# Patient Record
Sex: Female | Born: 1980
Health system: Southern US, Community
[De-identification: ages and names within clinical notes are randomized; demographics above are authoritative.]

## PROBLEM LIST (undated history)

## (undated) DIAGNOSIS — E349 Endocrine disorder, unspecified: Secondary | ICD-10-CM

## (undated) DIAGNOSIS — R748 Abnormal levels of other serum enzymes: Secondary | ICD-10-CM

## (undated) DIAGNOSIS — E282 Polycystic ovarian syndrome: Secondary | ICD-10-CM

## (undated) DIAGNOSIS — F32A Depression, unspecified: Secondary | ICD-10-CM

## (undated) DIAGNOSIS — L409 Psoriasis, unspecified: Secondary | ICD-10-CM

## (undated) DIAGNOSIS — F329 Major depressive disorder, single episode, unspecified: Secondary | ICD-10-CM

## (undated) DIAGNOSIS — N979 Female infertility, unspecified: Secondary | ICD-10-CM

## (undated) DIAGNOSIS — N912 Amenorrhea, unspecified: Secondary | ICD-10-CM

## (undated) HISTORY — DX: Psoriasis, unspecified: L40.9

## (undated) HISTORY — DX: Amenorrhea, unspecified: N91.2

## (undated) HISTORY — DX: Abnormal levels of other serum enzymes: R74.8

## (undated) HISTORY — PX: CHOLECYSTECTOMY: SHX55

## (undated) HISTORY — DX: Endocrine disorder, unspecified: E34.9

## (undated) HISTORY — PX: TONSILLECTOMY: SUR1361

## (undated) HISTORY — PX: COSMETIC SURGERY: SHX468

## (undated) HISTORY — PX: LAPAROSCOPIC GASTRIC SLEEVE RESECTION: SHX5895

## (undated) HISTORY — DX: Depression, unspecified: F32.A

## (undated) HISTORY — DX: Polycystic ovarian syndrome: E28.2

## (undated) HISTORY — DX: Major depressive disorder, single episode, unspecified: F32.9

## (undated) HISTORY — DX: Female infertility, unspecified: N97.9

---
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
## 2011-07-27 HISTORY — PX: BACK SURGERY: SHX140

## 2016-04-22 ENCOUNTER — Emergency Department (HOSPITAL_BASED_OUTPATIENT_CLINIC_OR_DEPARTMENT_OTHER)
Admission: EM | Admit: 2016-04-22 | Discharge: 2016-04-22 | Disposition: A | Discharge status: Home / Self Care | Payer: BLUE CROSS/BLUE SHIELD | Attending: Emergency Medicine | Admitting: Emergency Medicine

## 2016-04-22 ENCOUNTER — Encounter (HOSPITAL_BASED_OUTPATIENT_CLINIC_OR_DEPARTMENT_OTHER): Payer: Self-pay | Admitting: Emergency Medicine

## 2016-04-22 ENCOUNTER — Emergency Department (HOSPITAL_BASED_OUTPATIENT_CLINIC_OR_DEPARTMENT_OTHER): Payer: BLUE CROSS/BLUE SHIELD

## 2016-04-22 DIAGNOSIS — R1084 Generalized abdominal pain: Secondary | ICD-10-CM | POA: Diagnosis not present

## 2016-04-22 DIAGNOSIS — J988 Other specified respiratory disorders: Secondary | ICD-10-CM | POA: Diagnosis not present

## 2016-04-22 DIAGNOSIS — B9789 Other viral agents as the cause of diseases classified elsewhere: Secondary | ICD-10-CM

## 2016-04-22 DIAGNOSIS — Z79899 Other long term (current) drug therapy: Secondary | ICD-10-CM | POA: Diagnosis not present

## 2016-04-22 DIAGNOSIS — R000 Tachycardia, unspecified: Secondary | ICD-10-CM | POA: Insufficient documentation

## 2016-04-22 DIAGNOSIS — R509 Fever, unspecified: Secondary | ICD-10-CM | POA: Diagnosis present

## 2016-04-22 LAB — URINALYSIS, ROUTINE W REFLEX MICROSCOPIC
GLUCOSE, UA: NEGATIVE mg/dL
HGB URINE DIPSTICK: NEGATIVE
KETONES UR: NEGATIVE mg/dL
NITRITE: NEGATIVE
PH: 5.5 (ref 5.0–8.0)
Protein, ur: NEGATIVE mg/dL
SPECIFIC GRAVITY, URINE: 1.02 (ref 1.005–1.030)

## 2016-04-22 LAB — CBC WITH DIFFERENTIAL/PLATELET
Basophils Absolute: 0 10*3/uL (ref 0.0–0.1)
Basophils Relative: 0 %
EOS ABS: 0.1 10*3/uL (ref 0.0–0.7)
EOS PCT: 1 %
HCT: 41 % (ref 36.0–46.0)
Hemoglobin: 13.5 g/dL (ref 12.0–15.0)
LYMPHS ABS: 2.4 10*3/uL (ref 0.7–4.0)
LYMPHS PCT: 18 %
MCH: 31.5 pg (ref 26.0–34.0)
MCHC: 32.9 g/dL (ref 30.0–36.0)
MCV: 95.6 fL (ref 78.0–100.0)
MONO ABS: 0.8 10*3/uL (ref 0.1–1.0)
MONOS PCT: 6 %
Neutro Abs: 10 10*3/uL — ABNORMAL HIGH (ref 1.7–7.7)
Neutrophils Relative %: 75 %
PLATELETS: 231 10*3/uL (ref 150–400)
RBC: 4.29 MIL/uL (ref 3.87–5.11)
RDW: 13.5 % (ref 11.5–15.5)
WBC: 13.3 10*3/uL — ABNORMAL HIGH (ref 4.0–10.5)

## 2016-04-22 LAB — URINE MICROSCOPIC-ADD ON: RBC / HPF: NONE SEEN RBC/hpf (ref 0–5)

## 2016-04-22 LAB — COMPREHENSIVE METABOLIC PANEL
ALK PHOS: 77 U/L (ref 38–126)
ALT: 32 U/L (ref 14–54)
AST: 21 U/L (ref 15–41)
Albumin: 3.7 g/dL (ref 3.5–5.0)
Anion gap: 8 (ref 5–15)
BUN: 9 mg/dL (ref 6–20)
CALCIUM: 9.1 mg/dL (ref 8.9–10.3)
CHLORIDE: 102 mmol/L (ref 101–111)
CO2: 27 mmol/L (ref 22–32)
CREATININE: 0.62 mg/dL (ref 0.44–1.00)
GFR calc non Af Amer: >60 mL/min (ref >60)
Glucose, Bld: 106 mg/dL — ABNORMAL HIGH (ref 65–99)
Potassium: 3.2 mmol/L — ABNORMAL LOW (ref 3.5–5.1)
SODIUM: 137 mmol/L (ref 135–145)
Total Bilirubin: 0.7 mg/dL (ref 0.3–1.2)
Total Protein: 8 g/dL (ref 6.5–8.1)

## 2016-04-22 LAB — I-STAT CG4 LACTIC ACID, ED: Lactic Acid, Venous: 1.13 mmol/L (ref 0.5–1.9)

## 2016-04-22 LAB — PREGNANCY, URINE: Preg Test, Ur: NEGATIVE

## 2016-04-22 MED ORDER — IOPAMIDOL (ISOVUE-300) INJECTION 61%
100.0000 mL | Freq: Once | INTRAVENOUS | Status: AC | PRN
  Administered 2016-04-22: 100 mL via INTRAVENOUS

## 2016-04-22 NOTE — ED Triage Notes (Addendum)
Patient reports that she went to the urgent care about 2-3 days ago. The patient reports that she was there because of fever, Sore throat and tight distended abdominal pain. Reports that she is also having a head ache

## 2016-04-22 NOTE — ED Provider Notes (Signed)
[facility] Provider Note   CSN: [id number] Arrival date & time: 04/22/16  1650   By signing my name below, I, Avnee Patel, attest that this documentation has been prepared under the direction and in the presence of Melene Plan, DO  Electronically Signed: Clovis Pu, ED Scribe. 04/22/16. 7:48 PM.   History   Chief Complaint Chief Complaint  Patient presents with  . Fever   .  The history is provided by the patient. No language interpreter was used.   HPI Comments:  Rebecca Snyder is a 35 y.o. female, with a PSHx of a tummy tuck and back lift, who presents to the Emergency Department complaining of intermittent fevers onset 3 days ago. She notes associated sore throat, headaches, abdominal pain and abdominal swelling.  Pt notes her abdominal pain is exacerbated to the touch. She states she visited Urgent Care 3 days ago and was told she might have a bacterial infection. She was given Zofran and motrin with no relief. Pt denies associated cough.   History reviewed. No pertinent past medical history.  There are no active problems to display for this patient.   Past Surgical History:  Procedure Laterality Date  . BACK SURGERY    . CESAREAN SECTION    . CHOLECYSTECTOMY    . LAPAROSCOPIC GASTRIC SLEEVE RESECTION    . TONSILLECTOMY    . tummy tuck       OB History    No data available       Home Medications    Prior to Admission medications   Medication Sig Start Date End Date Taking? Authorizing Provider  Melatonin 10 MG CAPS Take by mouth.   Yes Historical Provider, MD  venlafaxine (EFFEXOR) 75 MG tablet Take 75 mg by mouth 2 (two) times daily.   Yes Historical Provider, MD  zolpidem (AMBIEN) 10 MG tablet Take 10 mg by mouth at bedtime as needed for sleep.   Yes Historical Provider, MD    Family History History reviewed. No pertinent family history.  Social History Social History  Substance Use Topics  . Smoking status: Never Smoker  . Smokeless  tobacco: Never Used  . Alcohol use No     Allergies   Review of patient's allergies indicates no known allergies.   Review of Systems Review of Systems  Constitutional: Positive for fever. Negative for chills.  HENT: Positive for sore throat. Negative for congestion and rhinorrhea.   Eyes: Negative for redness and visual disturbance.  Respiratory: Negative for cough, shortness of breath and wheezing.   Cardiovascular: Negative for chest pain and palpitations.  Gastrointestinal: Positive for abdominal pain. Negative for nausea and vomiting.  Genitourinary: Negative for dysuria and urgency.  Musculoskeletal: Negative for arthralgias and myalgias.  Skin: Negative for pallor and wound.  Neurological: Positive for headaches. Negative for dizziness.     Physical Exam Updated Vital Signs BP 118/72   Pulse 92   Temp 98.9 F (37.2 C) (Oral)   Resp 18   Ht 5\' 10"  (1.778 m)   Wt 230 lb (104.3 kg)   LMP 04/06/2016   SpO2 98%   BMI 33.00 kg/m   Physical Exam  Constitutional: She is oriented to person, place, and time. She appears well-developed and well-nourished. No distress.  HENT:  Head: Normocephalic and atraumatic.  Right Ear: Tympanic membrane normal.  Left Ear: Tympanic membrane normal.  Mouth/Throat: Posterior oropharyngeal erythema present.  No sinus tenderness. TM's normal bilaterally. Very mild posterior oropharynx erythema. Swollen turbinate and posterior  nasal drip   Eyes: EOM are normal. Pupils are equal, round, and reactive to light.  Neck: Normal range of motion. Neck supple.  Cardiovascular: Regular rhythm.  Exam reveals no gallop and no friction rub.   No murmur heard. Tachycardic  Pulmonary/Chest: Effort normal. She has no wheezes. She has no rales.  Abdominal: Soft. She exhibits no distension. There is tenderness.  Diffuse tenderness to abdomen on palpation    Musculoskeletal: She exhibits no edema or tenderness.  Neurological: She is alert and oriented  to person, place, and time.  Skin: Skin is warm and dry. She is not diaphoretic.  Psychiatric: She has a normal mood and affect. Her behavior is normal.  Nursing note and vitals reviewed.    ED Treatments / Results  DIAGNOSTIC STUDIES:  Oxygen Saturation is 99% on RA, normal by my interpretation.    COORDINATION OF CARE:  7:23 PM Discussed treatment plan with pt at bedside and pt agreed to plan.  Labs (all labs ordered are listed, but only abnormal results are displayed) Labs Reviewed  COMPREHENSIVE METABOLIC PANEL - Abnormal; Notable for the following:       Result Value   Potassium 3.2 (*)    Glucose, Bld 106 (*)    All other components within normal limits  URINALYSIS, ROUTINE W REFLEX MICROSCOPIC (NOT AT [facility]) - Abnormal; Notable for the following:    Color, Urine AMBER (*)    APPearance CLOUDY (*)    Bilirubin Urine SMALL (*)    Leukocytes, UA SMALL (*)    All other components within normal limits  CBC WITH DIFFERENTIAL/PLATELET - Abnormal; Notable for the following:    WBC 13.3 (*)    Neutro Abs 10.0 (*)    All other components within normal limits  URINE MICROSCOPIC-ADD ON - Abnormal; Notable for the following:    Squamous Epithelial / LPF 0-5 (*)    Bacteria, UA RARE (*)    All other components within normal limits  PREGNANCY, URINE  I-STAT CG4 LACTIC ACID, ED    EKG  EKG Interpretation None       Radiology Ct Abdomen Pelvis W Contrast  Result Date: 04/22/2016 CLINICAL DATA:  Flu like symptoms.  Lower abdominal pain. EXAM: CT ABDOMEN AND PELVIS WITH CONTRAST TECHNIQUE: Multidetector CT imaging of the abdomen and pelvis was performed using the standard protocol following bolus administration of intravenous contrast. CONTRAST:  100mL ISOVUE-300 IOPAMIDOL (ISOVUE-300) INJECTION 61% COMPARISON:  None. FINDINGS: Lower chest: No acute abnormality. Hepatobiliary: Mild hepatic steatosis. Previous coli cystectomy. No biliary dilatation. Pancreas: Unremarkable. No  pancreatic ductal dilatation or surrounding inflammatory changes. Spleen: Normal in size without focal abnormality. Adrenals/Urinary Tract: Normal adrenal glands. There is a small low attenuation structure in the posterior right kidney measuring 5 mm. Too small to reliably characterize. The urinary bladder appears normal. Stomach/Bowel: Postoperative changes involving the stomach compatible with sleeve gastrectomy. The small bowel loops have a normal caliber. There is no pathologic dilatation of the colon. Vascular/Lymphatic: No significant vascular findings are present. No enlarged abdominal or pelvic lymph nodes. Reproductive: Several cysts are identified involving the left ovary. The largest measures 3.7 cm. The uterus is unremarkable. Other: There is no ascites or focal fluid collections within the abdomen or pelvis. There is skin thickening and mild subcutaneous fat stranding involving the lower ventral abdominal wall. No fluid collections identified to suggest abscess. Musculoskeletal: The stomach is within normal limits. The small bowel loops have a normal course and caliber. No obstruction. Normal appearance  of the colon. IMPRESSION: 1. No acute findings identified within the abdomen or pelvis. 2. There is mild skin thickening and subcutaneous fat stranding involving the ventral abdominal wall without focal fluid collections. Findings are nonspecific and may reflect post surgical changes. Correlate for any clinical signs or symptoms of cellulitis. 3. Left ovary cyst. This is almost certainly benign, and no specific imaging follow up is recommended according to the Society of Radiologists in Ultrasound2010 Consensus Conference Statement (D Lenis Noon et al. Management of Asymptomatic Ovarian and Other Adnexal Cysts Imaged at [place]: Society of Radiologists in Ultrasound Consensus Conference Statement 2010. Radiology 256 (Sept 2010): 943-954.). Electronically Signed   By: Signa Kell M.D.   On: 04/22/2016 21:26     Procedures Procedures (including critical care time)  Medications Ordered in ED Medications  iopamidol (ISOVUE-300) 61 % injection 100 mL (100 mLs Intravenous Contrast Given 04/22/16 2026)     Initial Impression / Assessment and Plan / ED Course  I have reviewed the triage vital signs and the nursing notes.  Pertinent labs & imaging results that were available during my care of the patient were reviewed by me and considered in my medical decision making (see chart for details).  Clinical Course    35 yo F With a chief complaint of cough congestion fevers and chills. She had been seen recently in urgent care and called back because she had leukocytosis. Patient also recently had a tummy tuck and had some swelling of her abdomen. CT scan of the abdomen and pelvis is negative for acute process. On the CT read there is some concern for possible cellulitis though there is none clinically. I discussed the results with the patient she'll treat this as a likely viral illness. PCP follow-up.  11:44 PM:  I have discussed the diagnosis/risks/treatment options with the patient and believe the pt to be eligible for discharge home to follow-up with PCP. We also discussed returning to the ED immediately if new or worsening sx occur. We discussed the sx which are most concerning (e.g., sudden worsening pain, fever, inability to tolerate by mouth) that necessitate immediate return. Medications administered to the patient during their visit and any new prescriptions provided to the patient are listed below.  Medications given during this visit Medications  iopamidol (ISOVUE-300) 61 % injection 100 mL (100 mLs Intravenous Contrast Given 04/22/16 2026)     The patient appears reasonably screen and/or stabilized for discharge and I doubt any other medical condition or other [facility] requiring further screening, evaluation, or treatment in the ED at this time prior to discharge.    Final Clinical  Impressions(s) / ED Diagnoses   Final diagnoses:  Viral respiratory illness    New Prescriptions Discharge Medication List as of 04/22/2016 10:04 PM     I personally performed the services described in this documentation, which was scribed in my presence. The recorded information has been reviewed and is accurate.     Melene Plan, DO 04/22/16 2344

## 2016-04-22 NOTE — Discharge Instructions (Signed)
Take tylenol 2 pills 4 times a day and motrin 4 pills 3 times a day.  Drink plenty of fluids.  Return for worsening shortness of breath, headache, confusion. Follow up with your family doctor.   

## 2016-07-23 ENCOUNTER — Ambulatory Visit (INDEPENDENT_AMBULATORY_CARE_PROVIDER_SITE_OTHER): Payer: BLUE CROSS/BLUE SHIELD | Admitting: Internal Medicine

## 2016-07-23 ENCOUNTER — Encounter: Payer: Self-pay | Admitting: Internal Medicine

## 2016-07-23 ENCOUNTER — Other Ambulatory Visit: Payer: Self-pay | Admitting: Internal Medicine

## 2016-07-23 VITALS — BP 128/90 | HR 94 | Temp 98.3°F | Ht 70.0 in | Wt 249.0 lb

## 2016-07-23 DIAGNOSIS — Z872 Personal history of diseases of the skin and subcutaneous tissue: Secondary | ICD-10-CM | POA: Diagnosis not present

## 2016-07-23 DIAGNOSIS — M25561 Pain in right knee: Secondary | ICD-10-CM | POA: Diagnosis not present

## 2016-07-23 DIAGNOSIS — R609 Edema, unspecified: Secondary | ICD-10-CM | POA: Insufficient documentation

## 2016-07-23 DIAGNOSIS — Z9884 Bariatric surgery status: Secondary | ICD-10-CM | POA: Diagnosis not present

## 2016-07-23 DIAGNOSIS — G4709 Other insomnia: Secondary | ICD-10-CM

## 2016-07-23 DIAGNOSIS — M79641 Pain in right hand: Secondary | ICD-10-CM

## 2016-07-23 DIAGNOSIS — M2540 Effusion, unspecified joint: Secondary | ICD-10-CM

## 2016-07-23 DIAGNOSIS — M79642 Pain in left hand: Secondary | ICD-10-CM | POA: Diagnosis not present

## 2016-07-23 DIAGNOSIS — G4700 Insomnia, unspecified: Secondary | ICD-10-CM | POA: Insufficient documentation

## 2016-07-23 DIAGNOSIS — Z8659 Personal history of other mental and behavioral disorders: Secondary | ICD-10-CM | POA: Insufficient documentation

## 2016-07-23 MED ORDER — TRIAMTERENE-HCTZ 75-50 MG PO TABS: 1.0000 | ORAL_TABLET | Freq: Every day | ORAL | 0 refills | Status: DC

## 2016-07-23 NOTE — Patient Instructions (Signed)
Rheumatology and orthopedic consults pending. Lab work pending. For dependent edema, try Maxide 75 1 by mouth daily. Please give us a call in 2-4 weeks and let us know how you're doing.

## 2016-07-23 NOTE — Progress Notes (Signed)
Subjective:    Patient ID: Rebecca Snyder, female    DOB: 03/21/1981, 35 y.o.   MRN: [id number]  HPI  First visit for this 35 year old White Female who recently moved here Fall 2017 from [place] with her husband and daughter to accept a job which [employer] . 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  Patient was referred by Clarnce FlockHeather Headley who is also a patient here.  In December 2017 she had a lower extremity Doppler right leg because of pain and edema for 4 weeks. Result was negative.  Patient has a history of psoriasis affecting her face and ears and elbow. She uses clobetasol for that.  Patient has had considerable issues with right knee pain and swelling. She used to play soccer and was quite physically active when she was younger. Doesn't recall any specific knee injury.  She's been having some issues with dependent edema.  However, she says she has tried Lasix in the past and it made her sick.  She tried some anti-inflammatory medication over-the-counter to help with the pain. It did help the swelling in her knee but did not help pain.  She said in some ways it actually felt worse than when the knee was swollen.  She's had some joint pain in her hands particularly PIP joints. No redness or increased warmth that she has noticed.  No pain in her elbows or shoulders. No significant ankle pain.  Patient has a history of depression and insomnia. This started around the time her father passed away a few years ago. She was very close to him. She takes Effexor XR 75 mg daily, Ambien 10 mg daily at bedtime, melatonin 10 mg daily at bedtime.  She had a disectomy and laminectomy 2013 for lumbar disc disease. She doesn't recall what level.  She had abdominoplasty and back lift surgery in 2017.  Tonsillectomy 2005, cholecystectomy 2003, C-section 2003, in vitro fertilization 2015. Gastric sleeve surgery 2012. She lost about 80 pounds with a gastric sleeve  surgery but gained some 50 pounds back around the time of her father's illness and subsequent death  Her primary care doctor in [place] was Dr. Dennison Nancyarmelo Mendiola.  Social history: She is married. Has a college education. One daughter age 34. Husband is 948 years old with hypertension otherwise in good health.  Family history: Father died at age 34 with complications of alpha-1 antitrypsin pulmonary disease and nonalcoholic hepatic cirrhosis. Mother age 34 in good health. One brother age 34 who is a alpha-1 antitrypsin carrier.  Patient has been tested and does not have this.    Review of Systems Last menstrual period December 25. Patient complains of back pain both high and low. Dependent edema.  Joint pain is described above    Objective:   Physical Exam  Constitutional: She is oriented to person, place, and time. She appears well-developed and well-nourished. No distress.  Eyes: Conjunctivae and EOM are normal. Pupils are equal, round, and reactive to light. Right eye exhibits no discharge. Left eye exhibits no discharge. No scleral icterus.  Neck: No thyromegaly present.  Cardiovascular: Normal rate, regular rhythm and normal heart sounds.   No murmur heard. Pulmonary/Chest: Effort normal. No respiratory distress. She has no rales.  Musculoskeletal: She exhibits no edema.  No lower extremity pitting edema. Prominent Heberden's and Bouchard's nodes in several joints in both hands.  moderate effusion right knee.  Neurological: She is alert and oriented to person, place, and time. She has  normal reflexes. No cranial nerve deficit. Coordination normal.  Skin: Skin is warm and dry. She is not diaphoretic.  Horizontal scar and brought line from Back lift surgery  Psychiatric: She has a normal mood and affect. Her behavior is normal. Judgment and thought content normal.  Vitals reviewed.         Assessment & Plan:  I think she probably has osteoarthritis of her hands although it is  early for her to be developing that. Some concern for possible psoriatic arthritis. I do think she may have some internal derangement of her right knee.  Plan: Rheumatology labs are drawn today and pending.  Patient will be sent for rheumatology consultation and we will also make an appointment for to see orthopedist.  We will try Maxide 75 to see if this helps with dependent edema.

## 2016-07-24 LAB — SEDIMENTATION RATE: Sed Rate: 10 mm/hr (ref 0–20)

## 2016-07-27 ENCOUNTER — Telehealth: Payer: Self-pay | Admitting: Internal Medicine

## 2016-07-27 LAB — COMPREHENSIVE METABOLIC PANEL

## 2016-07-27 LAB — ANA: Anti Nuclear Antibody(ANA): NEGATIVE

## 2016-07-27 LAB — RHEUMATOID FACTOR: Rhuematoid fact SerPl-aCnc: <14 IU/mL (ref <14)

## 2016-07-27 LAB — TSH

## 2016-07-27 LAB — CYCLIC CITRUL PEPTIDE ANTIBODY, IGG

## 2016-07-27 NOTE — Telephone Encounter (Signed)
Call to [facility] to add CMET and TSH.  CBC was unable to be done due to time of original labs.

## 2016-07-28 ENCOUNTER — Telehealth: Payer: Self-pay | Admitting: Internal Medicine

## 2016-07-28 NOTE — Telephone Encounter (Signed)
Message sent to patient to come in for additional lab work.

## 2016-07-28 NOTE — Telephone Encounter (Signed)
[facility] called and they did not have enough blood to process TSH and CMP.

## 2016-07-28 NOTE — Telephone Encounter (Signed)
Patient should come back and have the studies done

## 2016-07-29 ENCOUNTER — Telehealth: Payer: Self-pay | Admitting: Internal Medicine

## 2016-07-29 ENCOUNTER — Encounter: Payer: Self-pay | Admitting: Internal Medicine

## 2016-07-29 MED ORDER — FLUCONAZOLE 150 MG PO TABS: 150.0000 mg | ORAL_TABLET | Freq: Once | ORAL | 1 refills | Status: AC

## 2016-07-29 NOTE — Telephone Encounter (Signed)
Just get a b-met for now.

## 2016-07-29 NOTE — Telephone Encounter (Signed)
Patient called to schedule f/u lab appointment, but wanted to know if you wanted her to have the traditional fasting labs drawn as well. Please advise.

## 2016-07-29 NOTE — Telephone Encounter (Signed)
Call in Diflucan as requested.

## 2016-07-30 ENCOUNTER — Other Ambulatory Visit: Payer: BLUE CROSS/BLUE SHIELD | Admitting: Internal Medicine

## 2016-08-11 ENCOUNTER — Ambulatory Visit (INDEPENDENT_AMBULATORY_CARE_PROVIDER_SITE_OTHER): Payer: BLUE CROSS/BLUE SHIELD | Admitting: Orthopaedic Surgery

## 2016-08-20 ENCOUNTER — Encounter: Payer: BLUE CROSS/BLUE SHIELD | Admitting: Obstetrics & Gynecology

## 2016-08-23 ENCOUNTER — Other Ambulatory Visit: Payer: Self-pay | Admitting: Internal Medicine

## 2016-08-23 DIAGNOSIS — M2550 Pain in unspecified joint: Secondary | ICD-10-CM

## 2016-08-23 LAB — COMPREHENSIVE METABOLIC PANEL
ALK PHOS: 76 U/L (ref 33–115)
ALT: 41 U/L — AB (ref 6–29)
AST: 37 U/L — AB (ref 10–30)
Albumin: 4 g/dL (ref 3.6–5.1)
BILIRUBIN TOTAL: 0.4 mg/dL (ref 0.2–1.2)
BUN: 7 mg/dL (ref 7–25)
CO2: 28 mmol/L (ref 20–31)
CREATININE: 0.71 mg/dL (ref 0.50–1.10)
Calcium: 9.3 mg/dL (ref 8.6–10.2)
Chloride: 102 mmol/L (ref 98–110)
Glucose, Bld: 127 mg/dL — ABNORMAL HIGH (ref 65–99)
Potassium: 4 mmol/L (ref 3.5–5.3)
SODIUM: 138 mmol/L (ref 135–146)
TOTAL PROTEIN: 7.7 g/dL (ref 6.1–8.1)

## 2016-08-23 LAB — CBC WITH DIFFERENTIAL/PLATELET
BASOS PCT: 0 %
Basophils Absolute: 0 cells/uL (ref 0–200)
EOS ABS: 98 {cells}/uL (ref 15–500)
EOS PCT: 2 %
HCT: 45.5 % — ABNORMAL HIGH (ref 35.0–45.0)
Hemoglobin: 15.4 g/dL (ref 11.7–15.5)
Lymphocytes Relative: 36 %
Lymphs Abs: 1764 cells/uL (ref 850–3900)
MCH: 31.8 pg (ref 27.0–33.0)
MCHC: 33.8 g/dL (ref 32.0–36.0)
MCV: 93.8 fL (ref 80.0–100.0)
MONOS PCT: 7 %
MPV: 8.5 fL (ref 7.5–12.5)
Monocytes Absolute: 343 cells/uL (ref 200–950)
NEUTROS ABS: 2695 {cells}/uL (ref 1500–7800)
Neutrophils Relative %: 55 %
PLATELETS: 219 10*3/uL (ref 140–400)
RBC: 4.85 MIL/uL (ref 3.80–5.10)
RDW: 13.4 % (ref 11.0–15.0)
WBC: 4.9 10*3/uL (ref 3.8–10.8)

## 2016-08-25 ENCOUNTER — Ambulatory Visit (INDEPENDENT_AMBULATORY_CARE_PROVIDER_SITE_OTHER): Payer: 59 | Admitting: Orthopaedic Surgery

## 2016-08-25 ENCOUNTER — Ambulatory Visit (INDEPENDENT_AMBULATORY_CARE_PROVIDER_SITE_OTHER): Payer: 59

## 2016-08-25 DIAGNOSIS — M25562 Pain in left knee: Secondary | ICD-10-CM

## 2016-08-25 DIAGNOSIS — G8929 Other chronic pain: Secondary | ICD-10-CM

## 2016-08-25 DIAGNOSIS — M25561 Pain in right knee: Secondary | ICD-10-CM

## 2016-08-25 NOTE — Progress Notes (Signed)
Office Visit Note   Patient: Rebecca Snyder           Date of Birth: 10/20/1980           MRN: [id number] Visit Date: 08/25/2016              Requested by: Margaree MackintoshMary J Baxley, MD [id number] [place], [place] [id number] PCP: Margaree MackintoshBAXLEY,MARY J, MD   Assessment & Plan: Visit Diagnoses:  1. Chronic pain of right knee   2. Chronic pain of left knee     Plan: Right now she is doing well. I gave her handout on hyaluronic acid and talked about other supplements to take for her knees such as glucosamine or Tumeric. She is to work on Dance movement psychotherapistquad strengthening exercises well. If her knees do flare up on her she should call us immediately to come in for an aspiration and injection is indicated.  Follow-Up Instructions: Return if symptoms worsen or fail to improve.   Orders:  Orders Placed This Encounter  Procedures  . XR Knee 1-2 Views Right  . XR Knee 1-2 Views Left   No orders of the defined types were placed in this encounter.     Procedures: No procedures performed   Clinical Data: No additional findings.   Subjective: Chief Complaint  Patient presents with  . Left Knee - Pain  . Right Knee - Pain    HPI He is a very pleasant individual with a history of bilateral knee pain is been going on for long period time. She does have psoriasis and autoimmune disorder. If she is in her car for long. Time her knees do swell. The swelling waxes and wanes. She denies instability injury to her knees. They do hurt going up and downstairs and she gets a lot of cracking and popping in her knees. She denies any mechanical symptoms in terms of locking or catching. She does not take any specific anti-inflammatories as of now. She says today is a good day for her knees. She's never had knee injections or aspirations either. Review of Systems She currently denies any chest pain, headache, short of breath, fever, chills, nausea, vomiting.  Objective: Vital Signs: There were no vitals taken for this  visit.  Physical Exam She is a very pleasant individual who is alert and awake 3 in no acute distress. She is not walking with any type of limp or using assistive device. Ortho Exam Both knees are examined today. Both knees are ligamentously stable. There is negative Lachman's and McMurray's signs. Neither knee has an effusion and range of motion are full. Both knees have significant patellofemoral crepitation. Her patellas do track laterally slightly. Specialty Comments:  No specialty comments available.  Imaging: Xr Knee 1-2 Views Left  Result Date: 08/25/2016 An AP and lateral of the left knee shows well maintained joint space. There is only some mild patellofemoral chondral malacia. There is no evidence of acute injury. There is no effusion.  Xr Knee 1-2 Views Right  Result Date: 08/25/2016 An AP and lateral of the right knee shows mild patellofemoral chondral malacia. The patella tracks slightly laterally. The joint space themselves are well maintained. There is no effusion. There is no evidence of acute bony injury. The overall alignment is well maintained.    PMFS History: Patient Active Problem List   Diagnosis Date Noted  . Right knee pain 07/23/2016  . Dependent edema 07/23/2016  . History of psoriasis 07/23/2016  . History of weight loss surgery 07/23/2016  .  Insomnia 07/23/2016  . History of depression 07/23/2016   No past medical history on file.  No family history on file.  Past Surgical History:  Procedure Laterality Date  . BACK SURGERY    . CESAREAN SECTION    . CHOLECYSTECTOMY    . LAPAROSCOPIC GASTRIC SLEEVE RESECTION    . TONSILLECTOMY    . tummy tuck      Social History   Occupational History  . Not on file.   Social History Main Topics  . Smoking status: Current Some Day Smoker  . Smokeless tobacco: Never Used  . Alcohol use No  . Drug use: No  . Sexual activity: Not on file

## 2016-08-26 ENCOUNTER — Encounter: Payer: Self-pay | Admitting: Internal Medicine

## 2016-08-26 ENCOUNTER — Ambulatory Visit (INDEPENDENT_AMBULATORY_CARE_PROVIDER_SITE_OTHER): Payer: 59 | Admitting: Internal Medicine

## 2016-08-26 VITALS — BP 120/80 | HR 91 | Temp 97.8°F | Ht 70.0 in

## 2016-08-26 DIAGNOSIS — J069 Acute upper respiratory infection, unspecified: Secondary | ICD-10-CM | POA: Diagnosis not present

## 2016-08-26 DIAGNOSIS — M25561 Pain in right knee: Secondary | ICD-10-CM

## 2016-08-26 DIAGNOSIS — R609 Edema, unspecified: Secondary | ICD-10-CM

## 2016-08-26 DIAGNOSIS — Z8659 Personal history of other mental and behavioral disorders: Secondary | ICD-10-CM

## 2016-08-26 DIAGNOSIS — R0602 Shortness of breath: Secondary | ICD-10-CM

## 2016-08-26 DIAGNOSIS — Z87898 Personal history of other specified conditions: Secondary | ICD-10-CM

## 2016-08-26 MED ORDER — METHYLPREDNISOLONE ACETATE 80 MG/ML IJ SUSP
80.0000 mg | Freq: Once | INTRAMUSCULAR | Status: AC
  Administered 2016-08-26: 80 mg via INTRAMUSCULAR

## 2016-08-26 MED ORDER — ZOLPIDEM TARTRATE 10 MG PO TABS: 10.0000 mg | ORAL_TABLET | Freq: Every evening | ORAL | 0 refills | Status: DC | PRN

## 2016-08-26 MED ORDER — VENLAFAXINE HCL ER 75 MG PO CP24: 75.0000 mg | ORAL_CAPSULE | Freq: Every day | ORAL | 1 refills | Status: DC

## 2016-08-26 MED ORDER — TRIAMTERENE-HCTZ 75-50 MG PO TABS
1.0000 | ORAL_TABLET | Freq: Every day | ORAL | 1 refills | Status: DC
Start: 2016-08-26 — End: 2017-02-11

## 2016-08-26 MED ORDER — AZITHROMYCIN 250 MG PO TABS: ORAL_TABLET | ORAL | 0 refills | Status: DC

## 2016-08-27 ENCOUNTER — Encounter: Payer: Self-pay | Admitting: Internal Medicine

## 2016-08-27 MED ORDER — HYDROCODONE-HOMATROPINE 5-1.5 MG/5ML PO SYRP: 5.0000 mL | ORAL_SOLUTION | Freq: Three times a day (TID) | ORAL | 0 refills | Status: DC | PRN

## 2016-08-27 NOTE — Telephone Encounter (Signed)
Patient requesting Hycodan for cough. Prescription printed. She will need to pick it up.

## 2016-08-29 ENCOUNTER — Encounter: Payer: Self-pay | Admitting: Internal Medicine

## 2016-08-30 MED ORDER — FLUCONAZOLE 150 MG PO TABS: 150.0000 mg | ORAL_TABLET | Freq: Once | ORAL | 1 refills | Status: AC

## 2016-08-30 NOTE — Telephone Encounter (Signed)
Diflucan 150 mg tab with one refill prescribed

## 2016-09-19 NOTE — Patient Instructions (Signed)
Zithromax Z-PAK take as directed. Delsym over-the-counter if needed for cough. Follow-up in 6 months. Continue Ambien for insomnia.

## 2016-09-19 NOTE — Progress Notes (Signed)
   Subjective:    Patient ID: Laurell Roofllison Corwin, female    DOB: 04/28/1981, 36 y.o.   MRN: [id number]  HPI She went to a meeting out-of-town for work recently and several colleagues who are also at the meeting  have come down with the flu. She has flulike symptoms. She's had fever malaise fatigue and myalgias. Has now developed coughing.  She is here today to follow-up on dependent edema which has improved considerably with Maxide 75. Basic metabolic panel will be drawn today  She saw Dr. Magnus IvanBlackman recently and got a good report regarding her knee issue.  Patient thinks that she is doing much better with regard to knee pain so she is not going to seek rheumatology consultation. The edema has improved considerably with diuretic.   Review of Systems see above     Objective:   Physical Exam TMs are slightly full. Pharynx slightly injected without exudate. Neck is supple without significant adenopathy. Chest clear.       Assessment & Plan:  Acute bronchitis secondary to influenza  Dependent edema-stable and improved  Knee pain-improved  Plan: Continue Maxide 75. Basic metabolic panel drawn and pending. Continue Ambien 10 mg at bedtime. Continue Effexor. May try Delsym for cough. Zithromax Z-PAK take as directed.  Addendum: Patient called the following day indicating cough was more severe so we gave her a printed prescription for Hycodan 1 teaspoon by mouth every 8 hours when necessary cough.

## 2016-10-20 ENCOUNTER — Encounter: Payer: Self-pay | Admitting: Internal Medicine

## 2016-10-21 ENCOUNTER — Other Ambulatory Visit: Payer: Self-pay

## 2016-10-21 MED ORDER — ZOLPIDEM TARTRATE 10 MG PO TABS: 10.0000 mg | ORAL_TABLET | Freq: Every evening | ORAL | 5 refills | Status: DC | PRN

## 2016-11-08 ENCOUNTER — Other Ambulatory Visit: Payer: Self-pay

## 2016-11-08 ENCOUNTER — Encounter: Payer: Self-pay | Admitting: Internal Medicine

## 2016-11-08 MED ORDER — FLUCONAZOLE 150 MG PO TABS: 150.0000 mg | ORAL_TABLET | Freq: Once | ORAL | 1 refills | Status: AC

## 2016-11-08 MED ORDER — CLOBETASOL PROPIONATE 0.05 % EX OINT: 1.0000 "application " | TOPICAL_OINTMENT | CUTANEOUS | 1 refills | Status: DC | PRN

## 2016-11-08 MED ORDER — CLOBETASOL PROPIONATE 0.05 % EX SOLN: 1.0000 "application " | CUTANEOUS | 2 refills | Status: DC | PRN

## 2017-01-16 ENCOUNTER — Other Ambulatory Visit: Payer: Self-pay | Admitting: Internal Medicine

## 2017-01-16 NOTE — Telephone Encounter (Signed)
Refill x 6 months 

## 2017-01-17 ENCOUNTER — Encounter: Payer: Self-pay | Admitting: Internal Medicine

## 2017-01-17 NOTE — Telephone Encounter (Signed)
Called in [id number] [place], [place] [address]: [phone]

## 2017-02-10 ENCOUNTER — Encounter: Payer: Self-pay | Admitting: Internal Medicine

## 2017-02-10 DIAGNOSIS — N390 Urinary tract infection, site not specified: Secondary | ICD-10-CM

## 2017-02-10 MED ORDER — FLUCONAZOLE 150 MG PO TABS
150.0000 mg | ORAL_TABLET | Freq: Once | ORAL | 1 refills | Status: AC
Start: 2017-02-10 — End: 2017-02-10

## 2017-02-10 NOTE — Telephone Encounter (Signed)
Medication sent to pharmacy  

## 2017-02-11 ENCOUNTER — Other Ambulatory Visit: Payer: Self-pay | Admitting: Internal Medicine

## 2017-02-11 NOTE — Telephone Encounter (Signed)
Refill x 6 months 

## 2017-02-14 ENCOUNTER — Telehealth: Payer: Self-pay | Admitting: Obstetrics & Gynecology

## 2017-02-14 NOTE — Telephone Encounter (Signed)
Called and left a message for patient to call back to schedule a new patient doctor referral for AEX. °

## 2017-02-15 NOTE — Telephone Encounter (Signed)
Called and left a message for patient to call back to schedule a new patient doctor referral. °

## 2017-03-04 ENCOUNTER — Ambulatory Visit: Payer: 59 | Admitting: Internal Medicine

## 2017-03-18 ENCOUNTER — Ambulatory Visit (INDEPENDENT_AMBULATORY_CARE_PROVIDER_SITE_OTHER): Payer: 59 | Admitting: Obstetrics and Gynecology

## 2017-03-18 ENCOUNTER — Encounter: Payer: Self-pay | Admitting: Obstetrics and Gynecology

## 2017-03-18 ENCOUNTER — Other Ambulatory Visit (HOSPITAL_COMMUNITY)
Admission: RE | Admit: 2017-03-18 | Discharge: 2017-03-18 | Disposition: A | Discharge status: Home / Self Care | Payer: 59 | Source: Ambulatory Visit | Attending: Obstetrics and Gynecology | Admitting: Obstetrics and Gynecology

## 2017-03-18 VITALS — BP 112/74 | HR 84 | Ht 70.0 in | Wt 255.0 lb

## 2017-03-18 DIAGNOSIS — Z01419 Encounter for gynecological examination (general) (routine) without abnormal findings: Secondary | ICD-10-CM

## 2017-03-18 DIAGNOSIS — N760 Acute vaginitis: Secondary | ICD-10-CM

## 2017-03-18 NOTE — Progress Notes (Signed)
36 y.o. Rebecca Snyder. Married Caucasian female here for annual exam.    States she has had PCOS most of her life.  Hx of infrequent menses and infertility. Her one child was a spontaneous conception with a prior partner. Did infertility tx with Clomid and IVF with current partner. Ultimately she has a miscarriage of twins at about 7 weeks.  Not trying for pregnancy but would welcome it.   Menses are regular and heavy and last 5 days.  Pad and tampon change every 2 hours for 1 - 2 days. Some fatigue but no sh anemia.  Normal 15.4  In Jan 208. She a lot of pain and cramping prior to cycles.  She has breast tenderness.  She is not pursuing contraception at this time or tx for this.  In the past with tried OCPs and felt "mean."  Hx of bacterial vaginosis around the time of her menses. Used boric acid suppositories and did not do well with this.  Wants testing for this.   Struggling to achieve weight loss.   Patient is negative for antitrypsin.    PCP: Marlan Palau, MD     Patient's last menstrual period was 03/09/2017 (exact date).           Sexually active: Yes.    The current method of family planning is none.    Exercising: No.  The patient does not participate in regular exercise at present. Smoker:  no  Health Maintenance: Pap:  04/2015, normal per patient History of abnormal Pap:  Yes, 2006, negative colpo MMG:  Never Colonoscopy:  Never BMD:   Never TDaP:  2009 Gardasil:   no HIV: 2003 with pregnancy Hep C:  NA Screening Labs:  Hb today: not collected, Urine today: not collected   reports that she has quit smoking. She has never used smokeless tobacco. She reports that she does not drink alcohol or use drugs.  Past Medical History:  Diagnosis Date  . Amenorrhea   . Depression   . Elevated liver enzymes   . Hormone disorder   . Infertility, female    IVF  . PCOS (polycystic ovarian syndrome) 1996  . Psoriasis     Past Surgical History:  Procedure Laterality  Date  . BACK SURGERY  2013   laminectomy/discectomy  . CESAREAN SECTION    . CHOLECYSTECTOMY    . COSMETIC SURGERY     tummy tuck and back lift  . LAPAROSCOPIC GASTRIC SLEEVE RESECTION    . TONSILLECTOMY      Current Outpatient Prescriptions  Medication Sig Dispense Refill  . clobetasol (TEMOVATE) 0.05 % external solution Apply 1 application topically as needed. 50 mL 2  . clobetasol ointment (TEMOVATE) 0.05 % Apply 1 application topically as needed. 30 g 1  . Melatonin 10 MG CAPS Take by mouth.    . Multiple Vitamins-Minerals (MULTIVITAMIN PO) Take by mouth daily.    Marland Kitchen triamterene-hydrochlorothiazide (MAXZIDE) 75-50 MG tablet TAKE 1 TABLET BY MOUTH DAILY. *MAX PER INSURANCE 90 tablet 1  . venlafaxine XR (EFFEXOR-XR) 75 MG 24 hr capsule TAKE 1 CAPSULE (75 MG TOTAL) BY MOUTH DAILY. 90 capsule 1  . zolpidem (AMBIEN) 10 MG tablet TAKE 1 TABLET BY MOUTH AT BEDTIME AS NEEDED FOR SLEEP *MAX 30 DAY SUPPLY PER INSURANCE 90 tablet 5   No current facility-administered medications for this visit.     Family History  Problem Relation Age of Onset  . Hypertension Mother        improved after weight loss  .  Diabetes Mother        better after weight loss  . Liver disease Father        non-alcoholic, caused by alpha-1 antitrypsin deficiency  . Alpha-1 antitrypsin deficiency Father   . Lung cancer Maternal Grandmother   . Heart attack Maternal Grandfather     ROS:  Pertinent items are noted in HPI.  Otherwise, a comprehensive ROS was negative.  Exam:   BP 112/74 (BP Location: Right Arm, Patient Position: Sitting, Cuff Size: Large)   Pulse 84   Ht 5\' 10"  (1.778 m)   Wt 255 lb (115.7 kg)   LMP 03/09/2017 (Exact Date)   BMI 36.59 kg/m     General appearance: alert, cooperative and appears stated age Head: Normocephalic, without obvious abnormality, atraumatic Neck: no adenopathy, supple, symmetrical, trachea midline and thyroid normal to inspection and palpation Lungs: clear to  auscultation bilaterally Breasts: normal appearance, no masses or tenderness, No nipple retraction or dimpling, No nipple discharge or bleeding, No axillary or supraclavicular adenopathy Heart: regular rate and rhythm Abdomen: soft, non-tender; no masses, no organomegaly Extremities: extremities normal, atraumatic, no cyanosis or edema Skin: Skin color, texture, turgor normal. No rashes or lesions Lymph nodes: Cervical, supraclavicular, and axillary nodes normal. No abnormal inguinal nodes palpated Neurologic: Grossly normal  Pelvic: External genitalia:  no lesions              Urethra:  normal appearing urethra with no masses, tenderness or lesions              Bartholins and Skenes: normal                 Vagina: normal appearing vagina with normal color and discharge, no lesions              Cervix: no lesions              Pap taken: Yes.   Bimanual Exam:  Uterus:  normal size, contour, position, consistency, mobility, non-tender              Adnexa: no mass, fullness, tenderness                Chaperone was present for exam.  Assessment:   Well woman visit with normal exam. Hx PCOS.  Remote hx abnormal pap.  Hx infertility.  Would welcome a pregnancy.  Hx weight loss surgery.  Hx recurrent BV.  Plan: Mammogram screening discussed. Recommended self breast awareness. Pap and HR HPV as above.  Vaginitis testing from pap also.  Would like Flagyl if has BV.  Guidelines for Calcium, Vitamin D, regular exercise program including cardiovascular and weight bearing exercise. She is on PNV.  Declines Mirena or hormonal tx for heavy menses. We talked about PCOS and the importance of regular cycles and good cardiovascular health. Follow up annually and prn.    After visit summary provided.

## 2017-03-18 NOTE — Patient Instructions (Signed)

## 2017-03-21 LAB — CYTOLOGY - PAP
Bacterial vaginitis: POSITIVE — AB
Candida vaginitis: NEGATIVE
DIAGNOSIS: NEGATIVE
HPV (WINDOPATH): NOT DETECTED
TRICH (WINDOWPATH): NEGATIVE

## 2017-03-23 ENCOUNTER — Encounter: Payer: Self-pay | Admitting: Obstetrics and Gynecology

## 2017-03-23 MED ORDER — METRONIDAZOLE 500 MG PO TABS: 500.0000 mg | ORAL_TABLET | Freq: Two times a day (BID) | ORAL | 0 refills | Status: DC

## 2017-03-23 NOTE — Addendum Note (Signed)
Addended by: Ardell IsaacsAMUNDSON C SILVA, Debbe BalesBROOK E on: 03/23/2017 05:40 PM   Modules accepted: Orders

## 2017-03-24 ENCOUNTER — Other Ambulatory Visit: Payer: Self-pay | Admitting: Obstetrics and Gynecology

## 2017-03-24 MED ORDER — FLUCONAZOLE 150 MG PO TABS: ORAL_TABLET | ORAL | 0 refills | Status: DC

## 2017-04-01 ENCOUNTER — Other Ambulatory Visit: Payer: Self-pay | Admitting: Internal Medicine

## 2017-04-26 ENCOUNTER — Other Ambulatory Visit: Payer: 59 | Admitting: Internal Medicine

## 2017-04-26 DIAGNOSIS — Z9884 Bariatric surgery status: Secondary | ICD-10-CM

## 2017-04-26 DIAGNOSIS — Z1321 Encounter for screening for nutritional disorder: Secondary | ICD-10-CM

## 2017-04-26 DIAGNOSIS — Z1329 Encounter for screening for other suspected endocrine disorder: Secondary | ICD-10-CM

## 2017-04-26 DIAGNOSIS — Z13220 Encounter for screening for lipoid disorders: Secondary | ICD-10-CM

## 2017-04-26 DIAGNOSIS — Z8659 Personal history of other mental and behavioral disorders: Secondary | ICD-10-CM

## 2017-04-26 DIAGNOSIS — R609 Edema, unspecified: Secondary | ICD-10-CM

## 2017-04-26 DIAGNOSIS — Z0000 Encounter for general adult medical examination without abnormal findings: Secondary | ICD-10-CM

## 2017-04-28 ENCOUNTER — Encounter: Payer: Self-pay | Admitting: Internal Medicine

## 2017-04-28 ENCOUNTER — Ambulatory Visit (INDEPENDENT_AMBULATORY_CARE_PROVIDER_SITE_OTHER): Payer: 59 | Admitting: Internal Medicine

## 2017-04-28 VITALS — BP 120/76 | HR 96 | Temp 98.7°F | Ht 68.25 in | Wt 251.0 lb

## 2017-04-28 DIAGNOSIS — E119 Type 2 diabetes mellitus without complications: Secondary | ICD-10-CM | POA: Diagnosis not present

## 2017-04-28 DIAGNOSIS — R609 Edema, unspecified: Secondary | ICD-10-CM | POA: Diagnosis not present

## 2017-04-28 DIAGNOSIS — Z872 Personal history of diseases of the skin and subcutaneous tissue: Secondary | ICD-10-CM

## 2017-04-28 DIAGNOSIS — Z0000 Encounter for general adult medical examination without abnormal findings: Secondary | ICD-10-CM | POA: Diagnosis not present

## 2017-04-28 DIAGNOSIS — E559 Vitamin D deficiency, unspecified: Secondary | ICD-10-CM | POA: Diagnosis not present

## 2017-04-28 DIAGNOSIS — G4709 Other insomnia: Secondary | ICD-10-CM | POA: Diagnosis not present

## 2017-04-28 DIAGNOSIS — Z9884 Bariatric surgery status: Secondary | ICD-10-CM

## 2017-04-28 LAB — POCT URINALYSIS DIPSTICK
BILIRUBIN UA: NEGATIVE
GLUCOSE UA: 500
KETONES UA: NEGATIVE
Leukocytes, UA: NEGATIVE
NITRITE UA: NEGATIVE
PH UA: 7 (ref 5.0–8.0)
PROTEIN UA: NEGATIVE
RBC UA: NEGATIVE
Urobilinogen, UA: 0.2 E.U./dL

## 2017-04-28 MED ORDER — CARVEDILOL 12.5 MG PO TABS: 12.5000 mg | ORAL_TABLET | Freq: Every day | ORAL | 1 refills | Status: DC

## 2017-04-28 MED ORDER — METRONIDAZOLE 500 MG PO TABS: 500.0000 mg | ORAL_TABLET | Freq: Two times a day (BID) | ORAL | 1 refills | Status: DC

## 2017-04-28 MED ORDER — FLUCONAZOLE 150 MG PO TABS: 150.0000 mg | ORAL_TABLET | Freq: Once | ORAL | 2 refills | Status: AC

## 2017-04-29 ENCOUNTER — Telehealth: Payer: Self-pay

## 2017-04-29 LAB — COMPLETE METABOLIC PANEL WITH GFR
AG Ratio: 1.1 (calc) (ref 1.0–2.5)
ALBUMIN MSPROF: 3.9 g/dL (ref 3.6–5.1)
ALT: 48 U/L — ABNORMAL HIGH (ref 6–29)
AST: 56 U/L — ABNORMAL HIGH (ref 10–30)
Alkaline phosphatase (APISO): 74 U/L (ref 33–115)
BUN: 12 mg/dL (ref 7–25)
CALCIUM: 9.1 mg/dL (ref 8.6–10.2)
CO2: 24 mmol/L (ref 20–32)
CREATININE: 0.66 mg/dL (ref 0.50–1.10)
Chloride: 100 mmol/L (ref 98–110)
GFR, EST AFRICAN AMERICAN: 133 mL/min/{1.73_m2} (ref >=60)
GFR, EST NON AFRICAN AMERICAN: 114 mL/min/{1.73_m2} (ref >=60)
GLOBULIN: 3.5 g/dL (ref 1.9–3.7)
GLUCOSE: 273 mg/dL — AB (ref 65–99)
Potassium: 4 mmol/L (ref 3.5–5.3)
SODIUM: 135 mmol/L (ref 135–146)
TOTAL PROTEIN: 7.4 g/dL (ref 6.1–8.1)
Total Bilirubin: 0.7 mg/dL (ref 0.2–1.2)

## 2017-04-29 LAB — CBC WITH DIFFERENTIAL/PLATELET
BASOS ABS: 29 {cells}/uL (ref 0–200)
BASOS PCT: 0.4 %
EOS PCT: 1.9 %
Eosinophils Absolute: 139 cells/uL (ref 15–500)
HEMATOCRIT: 45.4 % — AB (ref 35.0–45.0)
HEMOGLOBIN: 15.1 g/dL (ref 11.7–15.5)
LYMPHS ABS: 2139 {cells}/uL (ref 850–3900)
MCH: 32.1 pg (ref 27.0–33.0)
MCHC: 33.3 g/dL (ref 32.0–36.0)
MCV: 96.4 fL (ref 80.0–100.0)
MPV: 9.7 fL (ref 7.5–12.5)
Monocytes Relative: 4.8 %
NEUTROS ABS: 4643 {cells}/uL (ref 1500–7800)
Neutrophils Relative %: 63.6 %
Platelets: 203 10*3/uL (ref 140–400)
RBC: 4.71 10*6/uL (ref 3.80–5.10)
RDW: 12.2 % (ref 11.0–15.0)
Total Lymphocyte: 29.3 %
WBC mixed population: 350 cells/uL (ref 200–950)
WBC: 7.3 10*3/uL (ref 3.8–10.8)

## 2017-04-29 LAB — TEST AUTHORIZATION

## 2017-04-29 LAB — LIPID PANEL
CHOL/HDL RATIO: 3.7 (calc) (ref <5.0)
Cholesterol: 172 mg/dL (ref <200)
HDL: 47 mg/dL — ABNORMAL LOW (ref >50)
LDL Cholesterol (Calc): 102 mg/dL (calc) — ABNORMAL HIGH
NON-HDL CHOLESTEROL (CALC): 125 mg/dL (ref <130)
Triglycerides: 125 mg/dL (ref <150)

## 2017-04-29 LAB — VITAMIN D 25 HYDROXY (VIT D DEFICIENCY, FRACTURES): Vit D, 25-Hydroxy: 29 ng/mL — ABNORMAL LOW (ref 30–100)

## 2017-04-29 LAB — TSH: TSH: 3.11 mIU/L

## 2017-04-29 LAB — HEMOGLOBIN A1C W/OUT EAG: HEMOGLOBIN A1C: 11.4 %{Hb} — AB (ref <5.7)

## 2017-04-29 MED ORDER — SITAGLIPTIN PHOS-METFORMIN HCL 50-500 MG PO TABS: 1.0000 | ORAL_TABLET | Freq: Two times a day (BID) | ORAL | 0 refills | Status: DC

## 2017-04-29 NOTE — Telephone Encounter (Signed)
Sent medications.

## 2017-04-29 NOTE — Telephone Encounter (Signed)
-----   Message from Margaree Mackintosh, MD sent at 04/29/2017 10:03 AM EDT ----- Please call pt. Hgb AIC is 11.4% Start Janumet 50/500 watch accuchecks before meals and betime and keep record. RTC in one week.

## 2017-05-06 ENCOUNTER — Ambulatory Visit: Payer: 59 | Admitting: Internal Medicine

## 2017-05-09 ENCOUNTER — Ambulatory Visit (INDEPENDENT_AMBULATORY_CARE_PROVIDER_SITE_OTHER): Payer: 59 | Admitting: Internal Medicine

## 2017-05-09 ENCOUNTER — Encounter: Payer: Self-pay | Admitting: Internal Medicine

## 2017-05-09 VITALS — BP 108/60 | HR 81 | Temp 98.1°F | Wt 247.0 lb

## 2017-05-09 DIAGNOSIS — E119 Type 2 diabetes mellitus without complications: Secondary | ICD-10-CM | POA: Diagnosis not present

## 2017-05-13 ENCOUNTER — Other Ambulatory Visit: Payer: Self-pay | Admitting: Internal Medicine

## 2017-05-13 ENCOUNTER — Telehealth: Payer: Self-pay | Admitting: Internal Medicine

## 2017-05-13 MED ORDER — CLOBETASOL PROPIONATE 0.05 % EX SOLN: 1.0000 "application " | CUTANEOUS | 1 refills | Status: DC | PRN

## 2017-05-13 NOTE — Telephone Encounter (Signed)
Have received Rx refills from pharmacy and completed

## 2017-05-13 NOTE — Telephone Encounter (Signed)
Refill 6 months 

## 2017-05-13 NOTE — Telephone Encounter (Signed)
Patient requesting refill on Ambien and Clobetasol 0.05 % SOLUTION.  [facility] [address]  Thank you.  Best # for contact:  [phone]

## 2017-05-22 NOTE — Patient Instructions (Signed)
Continue Janumet 50/500 and follow-up November 13.  Weight on diabetic eye exam until you have been on Janumet for at least 6 weeks.  Continue to follow strict diabetic diet.  Stay well-hydrated.

## 2017-05-22 NOTE — Progress Notes (Signed)
Subjective:    Patient ID: Rebecca Snyder, female    DOB: 16-Dec-1980, 36 y.o.   MRN: [id number]  HPI 36 year old Female presents today for annual physical and evaluation of medical issues  She and her family moved here in the Fall 2017 from [place] with her husband and daughter to  accept a job with [employer].  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  In December 2017 she had a lower extremity Doppler study of the right leg because of pain and edema for 4 weeks.  The result was negative.  History of psoriasis affecting face ears and elbow.  She uses clobetasol.  Has had considerable issues with right knee pain and swelling.  She used to play soccer and was physically active when she was younger.  Does risk because any specific knee injury.  Issues with dependent edema.  Has been tried on diuretic.  History of joint pain in her hands.  History of depression and insomnia.  This started around the time her father passed away several years ago.  She was very close to him.  She takes Effexor for that.  Takes Ambien every night to sleep as well as melatonin.  She had abdominoplasty and back lift surgery in 2017.  Tonsillectomy 2005, cholecystectomy 2003, C-section 2003, in vitro fertilization 2015.  Gastric sleeve surgery 2012.  She lost about 80 pounds with a gastric sleeve surgery but gained some 50 pounds back around the time of her father's illness and subsequent death.  Social history: She is married.  Has a college education.  One daughter age 23.  Has been with history of hypertension.  Family history: Father died at age 34 with complications of alpha-1 antitrypsin pulmonary disease and nonalcoholic hepatic cirrhosis.  Mother age 51 in good health.  One brother age 98 who is an alpha 1 antitrypsin carrier.    Review of her lab work shows a vitamin D level of 29, total cholesterol 172, HDL 47, triglycerides 125, LDL cholesterol 102.  TSH normal.  SGOT 56  and SGPT 48.  Previously in 2017 SGOT was 37 SGPT was 41.  However her fasting glucose was 273 and previously had been 127.  Hemoglobin A1c 11.4%.  She is not acidotic and will be started on Janumet 50/500 with follow-up in 1 week.    Review of Systems  Constitutional: Positive for fatigue.  Respiratory: Negative.   Cardiovascular: Negative.   Gastrointestinal: Negative.   Genitourinary: Negative.   Musculoskeletal: Positive for arthralgias.  Neurological: Negative.   Psychiatric/Behavioral:       Long-standing history of insomnia       Objective:   Physical Exam  Constitutional: She is oriented to person, place, and time. She appears well-developed and well-nourished. No distress.  HENT:  Head: Normocephalic and atraumatic.  Right Ear: External ear normal.  Left Ear: External ear normal.  Mouth/Throat: Oropharynx is clear and moist.  Eyes: Pupils are equal, round, and reactive to light. Conjunctivae and EOM are normal. Right eye exhibits no discharge. Left eye exhibits no discharge.  Neck: No JVD present. No thyromegaly present.  Cardiovascular: Normal rate, normal heart sounds and intact distal pulses.   No murmur heard. Pulmonary/Chest:  Breasts normal female  Abdominal: Bowel sounds are normal. She exhibits no distension. There is no tenderness. There is no rebound and no guarding.  Genitourinary:  Genitourinary Comments: Deferred to GYN  Musculoskeletal: She exhibits no edema.  Lymphadenopathy:    She  has no cervical adenopathy.  Neurological: She is alert and oriented to person, place, and time. She has normal reflexes. No cranial nerve deficit. Coordination normal.  Skin: Skin is warm and dry. No rash noted. She is not diaphoretic.  Psychiatric: She has a normal mood and affect. Her behavior is normal. Thought content normal.  Vitals reviewed.         Assessment & Plan:  New onset type 2 diabetes mellitus.  She is not acidotic.  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  She does not want to see a dietitian right now.  We will follow-up with her in 1 week.

## 2017-05-22 NOTE — Progress Notes (Signed)
   Subjective:    Patient ID: Laurell Roofllison Riggenbach, female    DOB: 09/20/1980, 36 y.o.   MRN: [id number]  HPI she was here last week for physical examination and was found to have elevated fasting serum glucose with hemoglobin A1c of 11.4.  She has been following a strict diabetic diet.  Brings in multiple Accu-Chek readings which are very good today.  She is on Janumet 50/500 daily.  Urine specimen on October 4 showed no ketones but had glucose present.  No evidence of LE or nitrite.    Review of Systems see above     Objective:   Physical Exam  Neck supple.  Chest clear.  Cardiac exam regular rate and rhythm.  Extremities without edema.      Assessment & Plan:  New onset type 2 diabetes mellitus  Obesity  Plan: I am pleased with her progress on Janumet 50/500 and she will follow-up on November 13 or sooner if necessary.  Continue to follow strict diabetic diet.  Stay well-hydrated.  We will need to discuss statin medication when she returns.  Wait for a diabetic eye exam until she has been treated for at least 6 weeks.  Will need Prevnar 13.

## 2017-05-22 NOTE — Patient Instructions (Addendum)
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  She will start restricting calories and watching carbs.  She will follow-up in 1 week.

## 2017-05-29 ENCOUNTER — Telehealth: Payer: Self-pay | Admitting: Internal Medicine

## 2017-05-30 NOTE — Telephone Encounter (Signed)
Refill x 6 months 

## 2017-05-30 NOTE — Telephone Encounter (Signed)
Patient called to R/S her appointment to 11/30.  She hasn't been exercising and doing her diet to her best ability.  She needs a refill on her Janumet because of R/S her appointment.    Thank you.

## 2017-05-30 NOTE — Telephone Encounter (Signed)
Done

## 2017-06-07 ENCOUNTER — Ambulatory Visit: Payer: 59 | Admitting: Internal Medicine

## 2017-06-20 ENCOUNTER — Other Ambulatory Visit: Payer: Self-pay | Admitting: Internal Medicine

## 2017-06-24 ENCOUNTER — Ambulatory Visit (INDEPENDENT_AMBULATORY_CARE_PROVIDER_SITE_OTHER): Payer: 59 | Admitting: Internal Medicine

## 2017-06-24 ENCOUNTER — Encounter: Payer: Self-pay | Admitting: Internal Medicine

## 2017-06-24 VITALS — BP 102/60 | HR 66 | Wt 248.0 lb

## 2017-06-24 DIAGNOSIS — R739 Hyperglycemia, unspecified: Secondary | ICD-10-CM

## 2017-06-24 DIAGNOSIS — E119 Type 2 diabetes mellitus without complications: Secondary | ICD-10-CM | POA: Diagnosis not present

## 2017-06-24 DIAGNOSIS — Z6837 Body mass index (BMI) 37.0-37.9, adult: Secondary | ICD-10-CM

## 2017-06-24 DIAGNOSIS — F439 Reaction to severe stress, unspecified: Secondary | ICD-10-CM

## 2017-06-24 DIAGNOSIS — L309 Dermatitis, unspecified: Secondary | ICD-10-CM

## 2017-06-24 MED ORDER — CLOBETASOL PROPIONATE 0.05 % EX SOLN: 1.0000 "application " | Freq: Two times a day (BID) | CUTANEOUS | 1 refills | Status: AC

## 2017-06-24 NOTE — Patient Instructions (Signed)
Hemoglobin A1c drawn and pending with recommendations to follow.  Appointment made for February 12 with Dr. Doreen BeamWhitworth for eczema.  Consider consultation with Dr. Elenor LegatoBeasley-suggest patient contact that office.

## 2017-06-24 NOTE — Progress Notes (Signed)
   Subjective:    Patient ID: Rebecca Snyder, female    DOB: 03/31/1981, 36 y.o.   MRN: [id number]  HPI Here today to follow-up on new onset diabetes mellitus.  Being treated with Januvia.  Says Accu-Cheks have been excellent.  Continues to have significant stress at work with hiring issues.  Eczema has become a problem.  Would like to have Temovate ointment refilled.  We were able to get her an appointment with [facility] February 12 with Dr. Doreen BeamWhitworth.  She has a long-standing history of eczema and tried Temovate solution but prefers ointment.  She is concerned that she really has not lost much weight since starting glucose lowering medication.  Recommended that she see Dr. Dalbert GarnetBeasley.    Review of Systems no new complaints     Objective:   Physical Exam Not examined but spent 20 minutes speaking with her about these issues.  Says his eyesight has improved with glucose management.  Suggested eye exam after the first of the year.  Hemoglobin A1c drawn and pending       Assessment & Plan:  New onset diabetes treated with hemoglobin A1c drawn and pending.  Eczema-appointment with Dr. Doreen BeamWhitworth September 06, 2017  Situational stress at work  Obesity-patient contact Dr. Dalbert GarnetBeasley  Follow-up will be suggested once we reviewed hemoglobin A1c.  Diagnosis of diabetes was made in early October  25 minutes spent with patient and also obtaining dermatology appointment

## 2017-06-25 LAB — HEMOGLOBIN A1C
Hgb A1c MFr Bld: 7 % of total Hgb — ABNORMAL HIGH (ref <5.7)
Mean Plasma Glucose: 154 (calc)
eAG (mmol/L): 8.5 (calc)

## 2017-08-04 ENCOUNTER — Other Ambulatory Visit: Payer: Self-pay | Admitting: Internal Medicine

## 2017-08-05 ENCOUNTER — Telehealth: Payer: Self-pay | Admitting: Internal Medicine

## 2017-08-05 MED ORDER — METFORMIN HCL 500 MG PO TABS: 500.0000 mg | ORAL_TABLET | Freq: Two times a day (BID) | ORAL | 1 refills | Status: DC

## 2017-08-05 MED ORDER — SITAGLIPTIN PHOSPHATE 50 MG PO TABS: 50.0000 mg | ORAL_TABLET | Freq: Two times a day (BID) | ORAL | 5 refills | Status: DC

## 2017-08-05 NOTE — Telephone Encounter (Signed)
Please see if pharmacy will make that switch for her with my approval.

## 2017-08-05 NOTE — Telephone Encounter (Signed)
ESCRIBED

## 2017-08-05 NOTE — Telephone Encounter (Signed)
Patient wants to know if you will consider changing her medication from Janumet because her insurance will not cover it this year.  It will cost her over $400.  She did some research and they will cover the generic Januvia and Metformin.  Would you consider allowing her to take these 2 meds together since they are the combination of Janumet?    [facility] [address]  Best # for contact:  [phone]  Thank you.

## 2017-08-05 NOTE — Telephone Encounter (Signed)
Can you send to her pharmacy please?  Januvia 50 bid and Metformin 500 bid (these are replacing her Janumet 50-500 bid).  She can have 5 refills.    [facility] [address]    Thanks.

## 2017-08-08 ENCOUNTER — Encounter: Payer: Self-pay | Admitting: Internal Medicine

## 2017-08-08 ENCOUNTER — Telehealth: Payer: Self-pay | Admitting: Internal Medicine

## 2017-08-08 MED ORDER — LINAGLIPTIN 5 MG PO TABS: 5.0000 mg | ORAL_TABLET | Freq: Every day | ORAL | 0 refills | Status: DC

## 2017-08-08 NOTE — Telephone Encounter (Signed)
She believes after spending several hours researching that her insurance will cover Tradgenta because there's a generic for it.  Wants to know if we will try sending this to her pharmacy?  They won't cover Onglyza because there is NO generic for it.  So, she wants to try Tradgenta please.    Thanks so much.

## 2017-08-08 NOTE — Telephone Encounter (Signed)
Made patient aware.  She will call and find out about these medications and find out what they will cover and call back.

## 2017-08-08 NOTE — Telephone Encounter (Signed)
Januvia doesn't have a generic; so it was going to cost her $300+.  She did pick up Metformin and took it over the weekend.  She is wondering if there is something close in this family that she can take?  Or, should I just make her an appointment at this point to come in and discuss this with you?  States that she has been doing really well on the Janumet, but now that her insurance won't cover it any longer she just can't afford it any longer.    [facility] [address]  [phone]

## 2017-08-08 NOTE — Telephone Encounter (Signed)
She needs to find out if onglyza or Rebecca Snyder is covered. Usually one of these is covered by a plan. The other thought is a good Rx card to pay out of pocket. She will need to do this research and let us know.

## 2017-08-08 NOTE — Telephone Encounter (Signed)
Calling in 30 days of Tradjenta 5 mg daily equivalent to Januvia 50 mg daily

## 2017-08-09 DIAGNOSIS — E119 Type 2 diabetes mellitus without complications: Secondary | ICD-10-CM

## 2017-08-09 MED ORDER — GLIPIZIDE 5 MG PO TABS: 5.0000 mg | ORAL_TABLET | Freq: Every day | ORAL | 0 refills | Status: DC

## 2017-08-09 NOTE — Addendum Note (Signed)
Addended by: Gregery NaVALENCIA, Johncarlos Holtsclaw P on: 08/09/2017 10:48 AM   Modules accepted: Orders

## 2017-08-09 NOTE — Telephone Encounter (Signed)
E scribed,

## 2017-08-09 NOTE — Telephone Encounter (Signed)
Patient has contacted her insurance and they will cover glipizide. She would like this sent to her pharmacy, please thank you.

## 2017-08-09 NOTE — Telephone Encounter (Signed)
OK but not sure what dose she will need. Let's start with 5 mg before breakfast and see her next week.

## 2017-08-11 ENCOUNTER — Other Ambulatory Visit: Payer: Self-pay | Admitting: Internal Medicine

## 2017-08-11 ENCOUNTER — Other Ambulatory Visit: Payer: Self-pay

## 2017-08-11 MED ORDER — ZOLPIDEM TARTRATE 10 MG PO TABS: 10.0000 mg | ORAL_TABLET | Freq: Every day | ORAL | 5 refills | Status: DC

## 2017-08-11 NOTE — Telephone Encounter (Signed)
Received second request this evening about Ambien and called in this Rx #30 with 5 refills at 5:15 pm

## 2017-08-11 NOTE — Telephone Encounter (Signed)
Refill x 6 months 

## 2017-08-11 NOTE — Telephone Encounter (Signed)
This is another request. I think I sent you a note to refill x 6 months this morning.

## 2017-08-11 NOTE — Telephone Encounter (Signed)
Patient called to request a refill on her medication.  [facility]acy [id number] [place] [place] - [address] [phone] (Phone) [phone] (Fax)

## 2017-08-19 ENCOUNTER — Ambulatory Visit: Payer: 59 | Admitting: Internal Medicine

## 2017-09-05 ENCOUNTER — Other Ambulatory Visit: Payer: Self-pay | Admitting: Internal Medicine

## 2017-09-19 ENCOUNTER — Other Ambulatory Visit: Payer: Self-pay | Admitting: Internal Medicine

## 2017-10-18 ENCOUNTER — Other Ambulatory Visit: Payer: Self-pay | Admitting: Internal Medicine

## 2017-11-25 DIAGNOSIS — L400 Psoriasis vulgaris: Secondary | ICD-10-CM | POA: Diagnosis not present

## 2017-12-04 IMAGING — CT CT ABD-PELV W/ CM
2 of 4 series · 15 of 46 positions shown, 17 images · IV contrast (APPLIED)
Comparison: None.

CLINICAL DATA: Flu like symptoms.  Lower abdominal pain.

EXAM:
CT ABDOMEN AND PELVIS WITH CONTRAST
TECHNIQUE: Multidetector CT imaging of the abdomen and pelvis was performed
using the standard protocol following bolus administration of
intravenous contrast.
CONTRAST:  100mL RFSX66-VHH IOPAMIDOL (RFSX66-VHH) INJECTION 61%

[Series 2: axial st · axial · 0.98mm/px · z∈[-816,-291]mm · 12 of 115 slices shown, 14 images]
[im 5/115  soft-tissue]
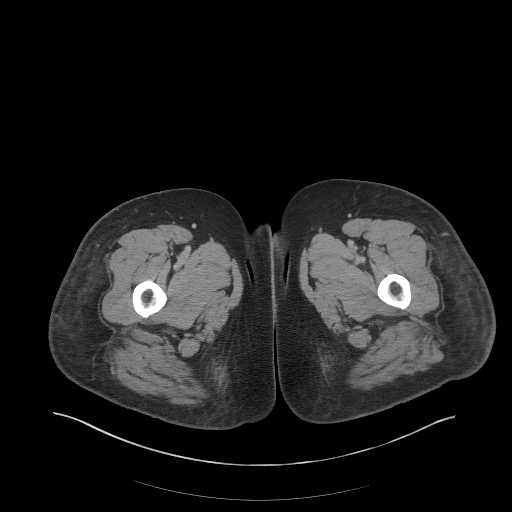
[im 5/115  bone]
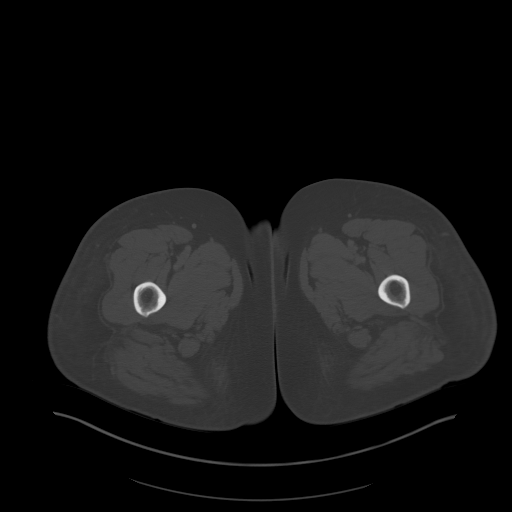
[im 15/115  soft-tissue]
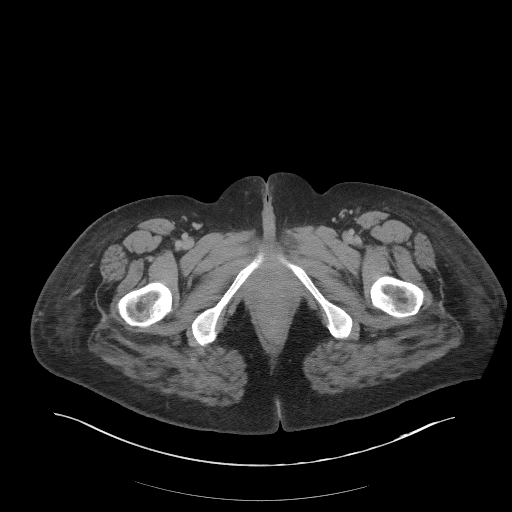
[im 25/115  soft-tissue]
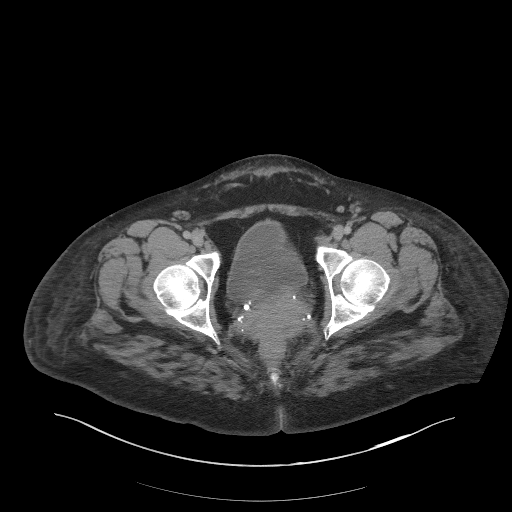
[im 35/115  soft-tissue]
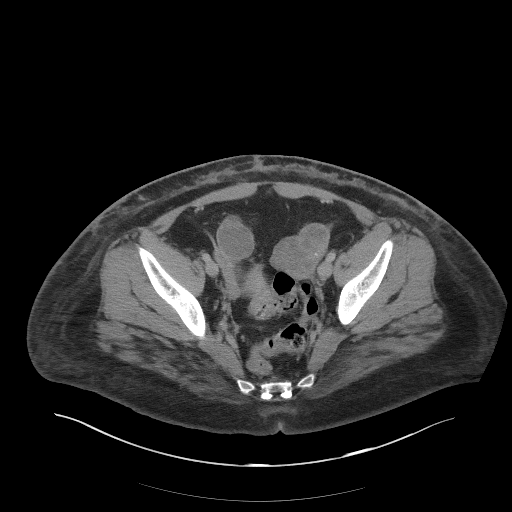
[im 45/115  soft-tissue]
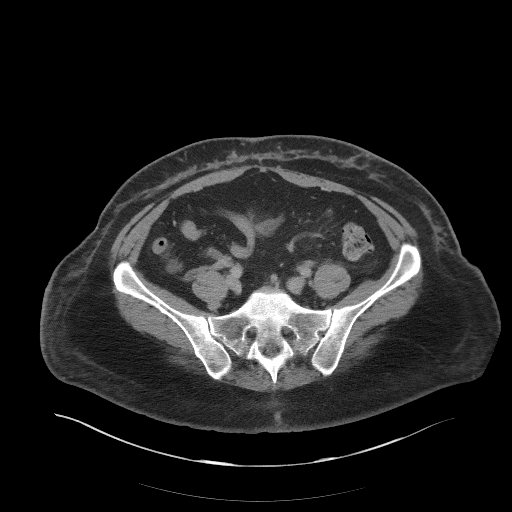
[im 55/115  soft-tissue]
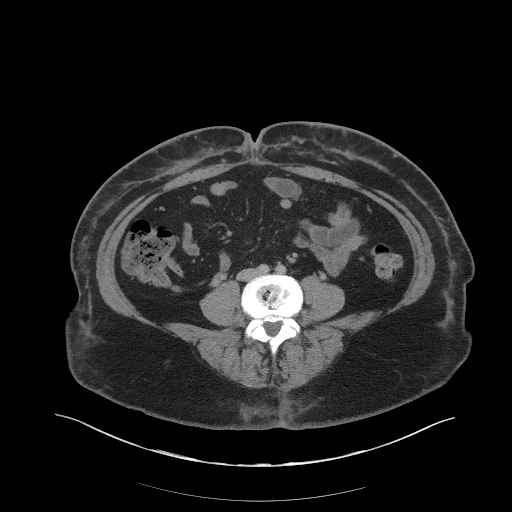
[im 60/115  soft-tissue]
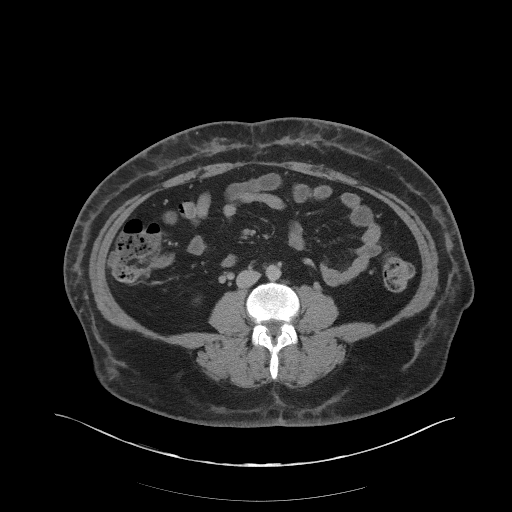
[im 70/115  soft-tissue]
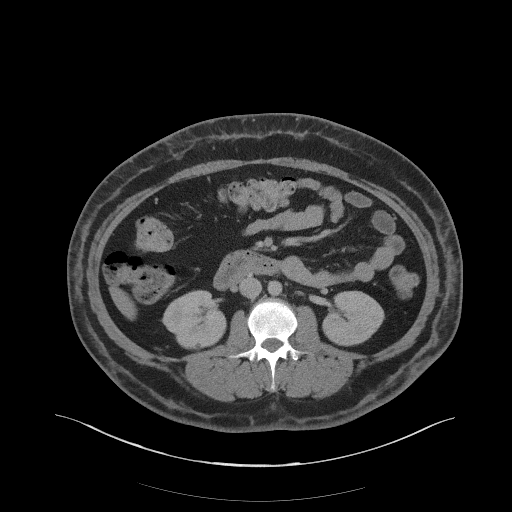
[im 80/115  soft-tissue]
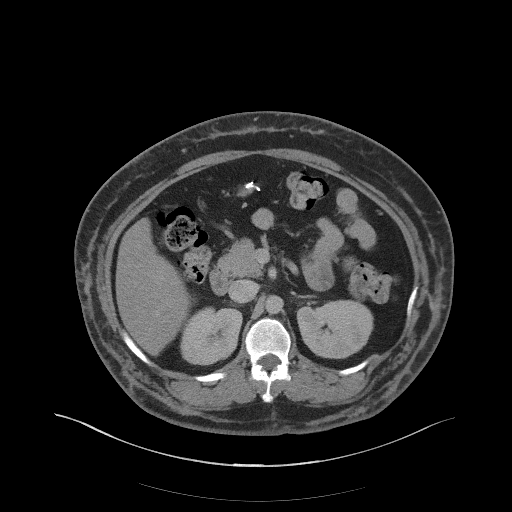
[im 80/115  bone]
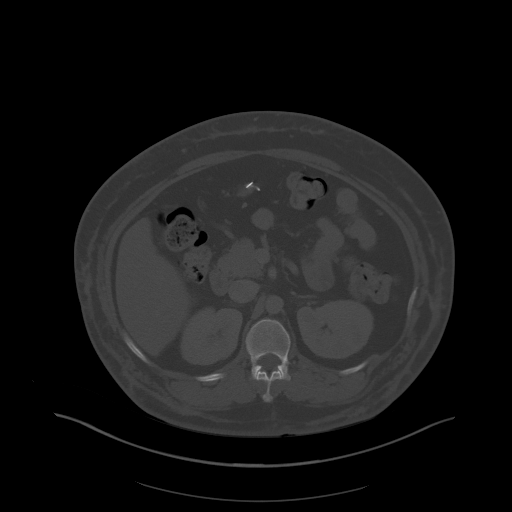
[im 90/115  soft-tissue]
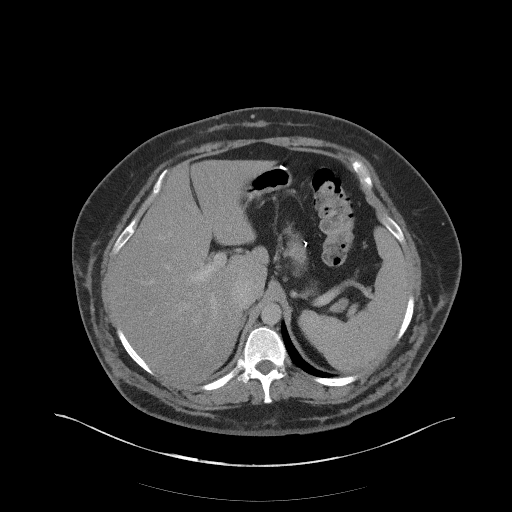
[im 100/115  soft-tissue]
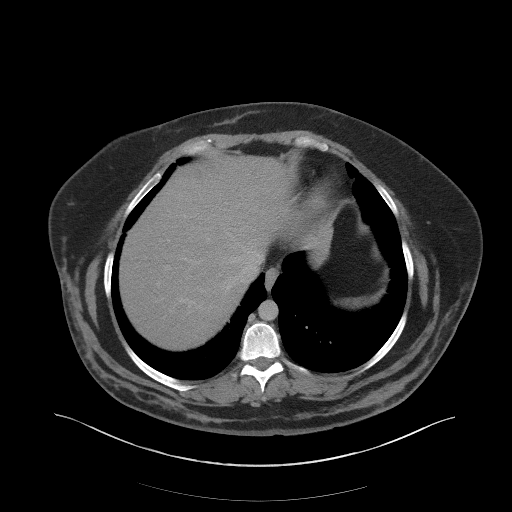
[im 110/115  soft-tissue]
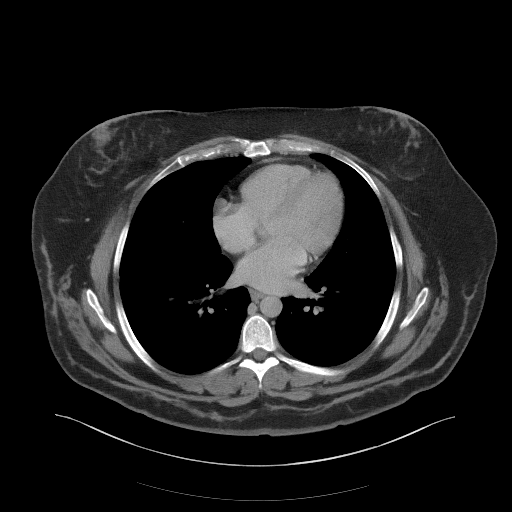

[Series 5: coronal st · coronal · 0.82mm/px · 3 of 104 slices shown]
[im 35/104  soft-tissue]
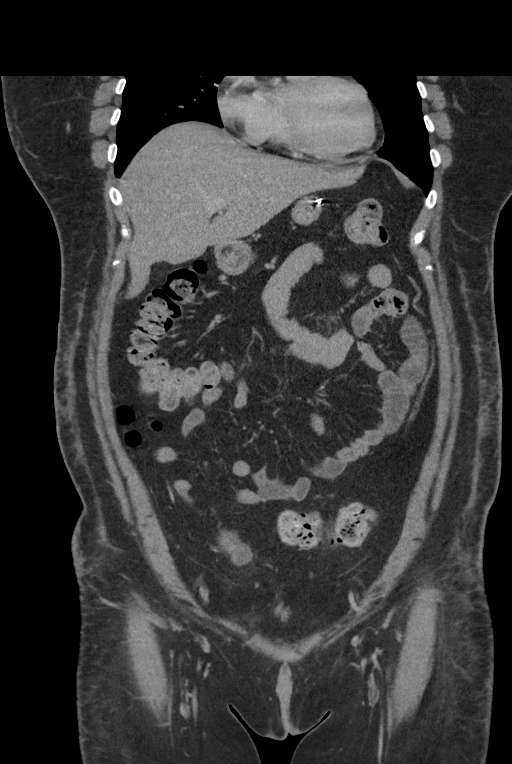
[im 46/104  soft-tissue]
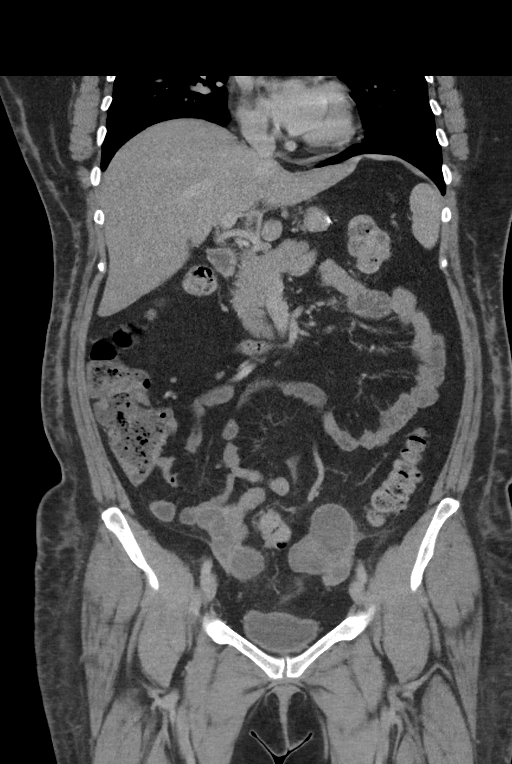
[im 58/104  soft-tissue]
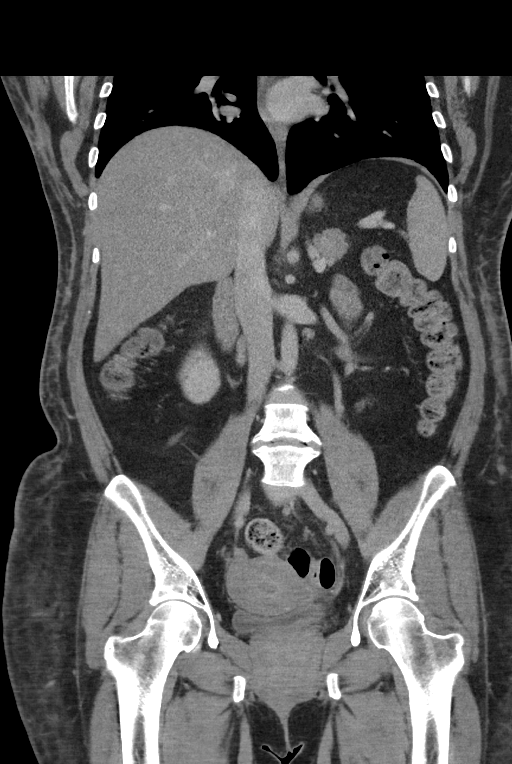

[15 of 46 positions shown; findings below may reference images not displayed]

FINDINGS: Lower chest: No acute abnormality.

Hepatobiliary: Mild hepatic steatosis. Previous coli cystectomy. No
biliary dilatation.

Pancreas: Unremarkable. No pancreatic ductal dilatation or
surrounding inflammatory changes.

Spleen: Normal in size without focal abnormality.

Adrenals/Urinary Tract: Normal adrenal glands. There is a small low
attenuation structure in the posterior right kidney measuring 5 mm.
Too small to reliably characterize. The urinary bladder appears
normal.

Stomach/Bowel: Postoperative changes involving the stomach
compatible with sleeve gastrectomy. The small bowel loops have a
normal caliber. There is no pathologic dilatation of the colon.

Vascular/Lymphatic: No significant vascular findings are present. No
enlarged abdominal or pelvic lymph nodes.

Reproductive: Several cysts are identified involving the left ovary.
The largest measures 3.7 cm. The uterus is unremarkable.

Other: There is no ascites or focal fluid collections within the
abdomen or pelvis. There is skin thickening and mild subcutaneous
fat stranding involving the lower ventral abdominal wall. No fluid
collections identified to suggest abscess.

Musculoskeletal: The stomach is within normal limits. The small
bowel loops have a normal course and caliber. No obstruction. Normal
appearance of the colon.
IMPRESSION: 1. No acute findings identified within the abdomen or pelvis.
2. There is mild skin thickening and subcutaneous fat stranding
involving the ventral abdominal wall without focal fluid
collections. Findings are nonspecific and may reflect post surgical
changes. Correlate for any clinical signs or symptoms of cellulitis.
3. Left ovary cyst. This is almost certainly benign, and no specific
imaging follow up is recommended according to the Society of
Radiologists in 2ltrasound2212 Consensus Conference Statement (MORI
Tripti et al. Management of Asymptomatic Ovarian and Other Adnexal
Cysts Imaged at US: Society of Radiologists in Ultrasound Consensus

## 2017-12-08 DIAGNOSIS — J069 Acute upper respiratory infection, unspecified: Secondary | ICD-10-CM | POA: Diagnosis not present

## 2017-12-26 ENCOUNTER — Encounter: Payer: Self-pay | Admitting: Obstetrics & Gynecology

## 2017-12-29 ENCOUNTER — Other Ambulatory Visit: Payer: Self-pay | Admitting: Internal Medicine

## 2018-01-04 ENCOUNTER — Other Ambulatory Visit: Payer: Self-pay | Admitting: Internal Medicine

## 2018-01-24 ENCOUNTER — Ambulatory Visit (INDEPENDENT_AMBULATORY_CARE_PROVIDER_SITE_OTHER): Payer: 59 | Admitting: Internal Medicine

## 2018-01-24 ENCOUNTER — Encounter: Payer: Self-pay | Admitting: Internal Medicine

## 2018-01-24 VITALS — BP 110/84 | HR 84 | Ht 68.25 in | Wt 250.0 lb

## 2018-01-24 DIAGNOSIS — E1121 Type 2 diabetes mellitus with diabetic nephropathy: Secondary | ICD-10-CM | POA: Diagnosis not present

## 2018-01-24 DIAGNOSIS — L309 Dermatitis, unspecified: Secondary | ICD-10-CM

## 2018-01-24 DIAGNOSIS — G4709 Other insomnia: Secondary | ICD-10-CM

## 2018-01-24 DIAGNOSIS — Z8659 Personal history of other mental and behavioral disorders: Secondary | ICD-10-CM | POA: Diagnosis not present

## 2018-01-24 MED ORDER — CLOBETASOL PROPIONATE 0.05 % EX OINT: TOPICAL_OINTMENT | Freq: Two times a day (BID) | CUTANEOUS | 5 refills | Status: AC

## 2018-01-24 MED ORDER — ZOLPIDEM TARTRATE 10 MG PO TABS: 10.0000 mg | ORAL_TABLET | Freq: Every day | ORAL | 2 refills | Status: DC

## 2018-01-24 MED ORDER — VENLAFAXINE HCL ER 75 MG PO CP24: 75.0000 mg | ORAL_CAPSULE | Freq: Every day | ORAL | 3 refills | Status: DC

## 2018-01-24 MED ORDER — TRIAMTERENE-HCTZ 75-50 MG PO TABS: ORAL_TABLET | ORAL | 3 refills | Status: DC

## 2018-01-24 MED ORDER — SALICYLIC ACID 2 % EX SHAM: MEDICATED_SHAMPOO | Freq: Every day | CUTANEOUS | 12 refills | Status: DC | PRN

## 2018-01-24 MED ORDER — SITAGLIPTIN PHOS-METFORMIN HCL 50-500 MG PO TABS: 1.0000 | ORAL_TABLET | Freq: Two times a day (BID) | ORAL | 5 refills | Status: DC

## 2018-01-24 MED ORDER — CARVEDILOL 12.5 MG PO TABS: 12.5000 mg | ORAL_TABLET | Freq: Every day | ORAL | 1 refills | Status: DC

## 2018-01-24 MED ORDER — CLOBETASOL PROPIONATE 0.05 % EX SOLN: 1.0000 "application " | CUTANEOUS | 1 refills | Status: DC | PRN

## 2018-01-28 ENCOUNTER — Other Ambulatory Visit: Payer: Self-pay | Admitting: Internal Medicine

## 2018-02-18 NOTE — Patient Instructions (Signed)
Medications refilled as requested.  Physical exam booked for October 2019.

## 2018-02-18 NOTE — Progress Notes (Signed)
   Subjective:    Patient ID: Rebecca Snyder, female    DOB: 03/15/1981, 37 y.o.   MRN: [id number]  HPI Patient has not been here since November 2018.  In the fall 2018 she was found to have diabetes mellitus.  She was started on Januvia and did very well.  History of eczema and we were able to get her an appointment [facility] on September 06, 2017 with Dr. Doreen BeamWhitworth but I am not sure she kept that appointment.  She needs eczema medications refilled.  She found out that she had high deductible insurance plan and that Januvia was extremely expensive.  Does not feel that she can afford lab work at this point in time.  We talked about this at length.  I did recommend that she get an over-the-counter home glucose test to see what her A1c is.  History of depression treated with Effexor.  History of insomnia treated with Ambien.  She is been paying out-of-pocket for Janumet 50/500 which she takes twice daily.  Coreg 12.5 mg daily which she has taken for blood pressure management.      Review of Systems see above     Objective:   Physical Exam 50 minutes talking with her about how to manage her medications and do a home glucose Accu-Chek.  She should call me with results.       Assessment & Plan:   Type 2 diabetes mellitus-hemoglobin A1c was 7% in November.  She will agrees to return in the Fall for health maintenance exam and  fasting labs.  Difficulty affording medication

## 2018-03-30 NOTE — Progress Notes (Signed)
37 y.o. [id number] Married Caucasian female here for annual exam.    Menses are monthly, a little over or under 30 days.  Heavy - tampon change every 1.5 hours for one to two days per cycle.  Has clotting.  Has cramping.  Declines birth control. Would welcome a pregnancy.   Less BV now due to less tampon use.   New dx of diabetes.  Hgb A1C 11.2. Quickly brought it down to 7.0.   Traveling a little.  Works for a [employer].  Is a nurse.  PCP:   Dr Lenord Fellers  Patient's last menstrual period was 03/10/2018 (approximate).     Period Cycle (Days): (irregular due to PCOS)     Sexually active: Yes.    The current method of family planning is none.    Exercising: Yes.    n/a Smoker:  no  Health Maintenance: Pap:  03/18/2017 normal, neg HR HPV. History of abnormal Pap:  Yes, many years ago.  MMG:  n/a Colonoscopy:  n/a BMD:   n/a  Result  n/a TDaP:  Less than 5 years ago Gardasil:   no [id number] Hep C:unsure Screening Labs:  Hb today: n/a, Urine today: n/a   reports that she has quit smoking. She has never used smokeless tobacco. She reports that she does not drink alcohol or use drugs.  Past Medical History:  Diagnosis Date  . Amenorrhea   . Depression   . Elevated liver enzymes   . Hormone disorder   . Infertility, female    IVF  . PCOS (polycystic ovarian syndrome) 1996  . Psoriasis     Past Surgical History:  Procedure Laterality Date  . BACK SURGERY  2013   laminectomy/discectomy  . CESAREAN SECTION    . CHOLECYSTECTOMY    . COSMETIC SURGERY     tummy tuck and back lift  . LAPAROSCOPIC GASTRIC SLEEVE RESECTION    . TONSILLECTOMY      Current Outpatient Medications  Medication Sig Dispense Refill  . carvedilol (COREG) 12.5 MG tablet Take 1 tablet (12.5 mg total) by mouth daily. 90 tablet 1  . cholecalciferol (VITAMIN D) 1000 units tablet Take 4,000 Units by mouth daily.    . clobetasol (TEMOVATE) 0.05 % external solution Apply 1 application topically  2 (two) times daily. 50 mL 1  . clobetasol (TEMOVATE) 0.05 % external solution Apply 1 application topically as needed. 50 mL 1  . clobetasol ointment (TEMOVATE) 0.05 % Apply topically 2 (two) times daily. 30 g 5  . coal tar-salicylic acid 2 % shampoo Apply topically daily as needed for itching. 120 mL 12  . Melatonin 10 MG CAPS Take by mouth.    . Multiple Vitamins-Minerals (MULTIVITAMIN PO) Take by mouth daily.    . ONE TOUCH ULTRA TEST test strip USE AS DIRECTED 4 TIMES DAILY 100 each prn  . ONETOUCH DELICA LANCETS 33G MISC USE AS DIRECTED 4 TIMES DAILY 100 each 0  . sitaGLIPtin-metformin (JANUMET) 50-500 MG tablet Take 1 tablet by mouth 2 (two) times daily with a meal. 60 tablet 5  . triamterene-hydrochlorothiazide (MAXZIDE) 75-50 MG tablet TAKE 1 TABLET BY MOUTH DAILY. *MAX PER INSURANCE 30 tablet 3  . venlafaxine XR (EFFEXOR-XR) 75 MG 24 hr capsule Take 1 capsule (75 mg total) by mouth daily. 90 capsule 3  . zolpidem (AMBIEN) 10 MG tablet Take 1 tablet (10 mg total) by mouth at bedtime. 30 tablet 2   No current facility-administered medications for this visit.  Family History  Problem Relation Age of Onset  . Hypertension Mother        improved after weight loss  . Diabetes Mother        better after weight loss  . Liver disease Father        non-alcoholic, caused by alpha-1 antitrypsin deficiency  . Alpha-1 antitrypsin deficiency Father   . Lung cancer Maternal Grandmother   . Heart attack Maternal Grandfather     Review of Systems  Constitutional: Negative.   HENT: Negative.   Eyes: Negative.   Respiratory: Negative.   Cardiovascular: Negative.   Gastrointestinal: Negative.   Endocrine: Negative.   Genitourinary: Negative.   Musculoskeletal: Negative.   Skin: Negative.   Allergic/Immunologic: Negative.   Neurological: Negative.   Hematological: Negative.   Psychiatric/Behavioral: Negative.   All other systems reviewed and are negative.   Exam:   BP 132/82    Pulse (!) 110   Ht 5' 8.5" (1.74 m)   Wt 251 lb (113.9 kg)   LMP 03/10/2018 (Approximate)   BMI 37.61 kg/m     General appearance: alert, cooperative and appears stated age Head: Normocephalic, without obvious abnormality, atraumatic Neck: no adenopathy, supple, symmetrical, trachea midline and thyroid normal to inspection and palpation Lungs: clear to auscultation bilaterally Breasts: normal appearance, no masses or tenderness, No nipple retraction or dimpling, No nipple discharge or bleeding, No axillary or supraclavicular adenopathy Heart: regular rate and rhythm Abdomen: abdominoplasty scars, Abdomen is soft, non-tender; no masses, no organomegaly Extremities: extremities normal, atraumatic, no cyanosis or edema Skin: Skin color, texture, turgor normal. No rashes or lesions Lymph nodes: Cervical, supraclavicular, and axillary nodes normal. No abnormal inguinal nodes palpated Neurologic: Grossly normal  Pelvic: External genitalia:  no lesions              Urethra:  normal appearing urethra with no masses, tenderness or lesions              Bartholins and Skenes: normal                 Vagina: normal appearing vagina with normal color and discharge, no lesions              Cervix: no lesions              Pap taken: No. Bimanual Exam:  Uterus:  normal size, contour, position, consistency, mobility, non-tender              Adnexa: no mass, fullness, tenderness             Chaperone was present for exam.  Assessment:   Well woman visit with normal exam. Hx PCOS.  Remote hx abnormal pap.  Hx infertility.  Would welcome a pregnancy.  Hx weight loss surgery.  Hx abdominoplasty.  Hx recurrent BV. New dx DM.  Psoriasis.   Plan: Mammogram screening age 69. Recommended self breast awareness. Pap and HR HPV as above. Guidelines for Calcium, Vitamin D, regular exercise program including cardiovascular and weight bearing exercise. Discussed Gardasil vaccine.  She declines.   Continue PNV.  She may benefit from an MFM consultation regarding her diabetes and desire for future pregnancy.  Follow up annually and prn.   After visit summary provided.

## 2018-03-31 ENCOUNTER — Ambulatory Visit (INDEPENDENT_AMBULATORY_CARE_PROVIDER_SITE_OTHER): Payer: 59 | Admitting: Obstetrics and Gynecology

## 2018-03-31 ENCOUNTER — Encounter: Payer: Self-pay | Admitting: Obstetrics and Gynecology

## 2018-03-31 ENCOUNTER — Other Ambulatory Visit: Payer: Self-pay

## 2018-03-31 VITALS — BP 132/82 | HR 110 | Ht 68.5 in | Wt 251.0 lb

## 2018-03-31 DIAGNOSIS — Z01419 Encounter for gynecological examination (general) (routine) without abnormal findings: Secondary | ICD-10-CM | POA: Diagnosis not present

## 2018-03-31 NOTE — Patient Instructions (Signed)

## 2018-04-03 ENCOUNTER — Telehealth: Payer: Self-pay | Admitting: Obstetrics and Gynecology

## 2018-04-03 DIAGNOSIS — E119 Type 2 diabetes mellitus without complications: Secondary | ICD-10-CM | POA: Insufficient documentation

## 2018-04-03 NOTE — Telephone Encounter (Signed)
Left message to call Sadey Yandell at [phone].  

## 2018-04-03 NOTE — Telephone Encounter (Signed)
Spoke with patient, advised as seen below per Dr. Edward Jolly. Patient declines referral at this time. Patient aware to return call to office if she decides to proceed with referral or with any additional questions. Patient thankful for return call.   Routing to provider for final review. Patient is agreeable to disposition. Will close encounter.

## 2018-04-03 NOTE — Telephone Encounter (Signed)
Please contact patient regarding her desire for future pregnancy.  She was diagnosed with diabetes in this last year.   I thought she may benefit from a consultation with Maternal Fetal Medicine regarding pregnancy planning.   Please reach out to her and see if she is interested in this option.

## 2018-04-21 ENCOUNTER — Other Ambulatory Visit: Payer: Self-pay | Admitting: Internal Medicine

## 2018-04-21 DIAGNOSIS — Z8659 Personal history of other mental and behavioral disorders: Secondary | ICD-10-CM

## 2018-04-21 DIAGNOSIS — E1121 Type 2 diabetes mellitus with diabetic nephropathy: Secondary | ICD-10-CM

## 2018-04-21 DIAGNOSIS — E559 Vitamin D deficiency, unspecified: Secondary | ICD-10-CM

## 2018-04-21 DIAGNOSIS — G4709 Other insomnia: Secondary | ICD-10-CM

## 2018-04-21 DIAGNOSIS — L309 Dermatitis, unspecified: Secondary | ICD-10-CM

## 2018-04-25 ENCOUNTER — Other Ambulatory Visit: Payer: Self-pay | Admitting: Internal Medicine

## 2018-04-27 ENCOUNTER — Other Ambulatory Visit: Payer: 59 | Admitting: Internal Medicine

## 2018-05-01 ENCOUNTER — Other Ambulatory Visit: Payer: 59 | Admitting: Internal Medicine

## 2018-05-01 DIAGNOSIS — G4709 Other insomnia: Secondary | ICD-10-CM

## 2018-05-01 DIAGNOSIS — L309 Dermatitis, unspecified: Secondary | ICD-10-CM | POA: Diagnosis not present

## 2018-05-01 DIAGNOSIS — E559 Vitamin D deficiency, unspecified: Secondary | ICD-10-CM

## 2018-05-01 DIAGNOSIS — E1121 Type 2 diabetes mellitus with diabetic nephropathy: Secondary | ICD-10-CM

## 2018-05-01 DIAGNOSIS — Z8659 Personal history of other mental and behavioral disorders: Secondary | ICD-10-CM

## 2018-05-02 ENCOUNTER — Ambulatory Visit (INDEPENDENT_AMBULATORY_CARE_PROVIDER_SITE_OTHER): Payer: 59 | Admitting: Internal Medicine

## 2018-05-02 ENCOUNTER — Encounter: Payer: Self-pay | Admitting: Internal Medicine

## 2018-05-02 VITALS — BP 110/70 | HR 92 | Ht 68.5 in | Wt 250.0 lb

## 2018-05-02 DIAGNOSIS — E1121 Type 2 diabetes mellitus with diabetic nephropathy: Secondary | ICD-10-CM

## 2018-05-02 DIAGNOSIS — R609 Edema, unspecified: Secondary | ICD-10-CM

## 2018-05-02 DIAGNOSIS — Z9884 Bariatric surgery status: Secondary | ICD-10-CM

## 2018-05-02 DIAGNOSIS — Z0000 Encounter for general adult medical examination without abnormal findings: Secondary | ICD-10-CM

## 2018-05-02 DIAGNOSIS — Z6837 Body mass index (BMI) 37.0-37.9, adult: Secondary | ICD-10-CM | POA: Diagnosis not present

## 2018-05-02 DIAGNOSIS — G4709 Other insomnia: Secondary | ICD-10-CM | POA: Diagnosis not present

## 2018-05-02 LAB — POCT URINALYSIS DIPSTICK
BILIRUBIN UA: NEGATIVE
GLUCOSE UA: POSITIVE — AB
KETONES UA: NEGATIVE
LEUKOCYTES UA: NEGATIVE
NITRITE UA: NEGATIVE
PH UA: 6 (ref 5.0–8.0)
Protein, UA: POSITIVE — AB
RBC UA: NEGATIVE
Spec Grav, UA: 1.015 (ref 1.010–1.025)
Urobilinogen, UA: 0.2 E.U./dL

## 2018-05-02 LAB — CBC WITH DIFFERENTIAL/PLATELET
BASOS ABS: 37 {cells}/uL (ref 0–200)
BASOS PCT: 0.5 %
EOS ABS: 148 {cells}/uL (ref 15–500)
EOS PCT: 2 %
HEMATOCRIT: 43.8 % (ref 35.0–45.0)
HEMOGLOBIN: 14.9 g/dL (ref 11.7–15.5)
LYMPHS ABS: 1850 {cells}/uL (ref 850–3900)
MCH: 32.1 pg (ref 27.0–33.0)
MCHC: 34 g/dL (ref 32.0–36.0)
MCV: 94.4 fL (ref 80.0–100.0)
MONOS PCT: 5.7 %
MPV: 9.6 fL (ref 7.5–12.5)
NEUTROS ABS: 4943 {cells}/uL (ref 1500–7800)
Neutrophils Relative %: 66.8 %
Platelets: 229 10*3/uL (ref 140–400)
RBC: 4.64 10*6/uL (ref 3.80–5.10)
RDW: 12.2 % (ref 11.0–15.0)
Total Lymphocyte: 25 %
WBC mixed population: 422 cells/uL (ref 200–950)
WBC: 7.4 10*3/uL (ref 3.8–10.8)

## 2018-05-02 LAB — COMPLETE METABOLIC PANEL WITH GFR
AG RATIO: 1.3 (calc) (ref 1.0–2.5)
ALT: 41 U/L — AB (ref 6–29)
AST: 46 U/L — AB (ref 10–30)
Albumin: 4.2 g/dL (ref 3.6–5.1)
Alkaline phosphatase (APISO): 68 U/L (ref 33–115)
BUN: 7 mg/dL (ref 7–25)
CALCIUM: 9.2 mg/dL (ref 8.6–10.2)
CO2: 23 mmol/L (ref 20–32)
Chloride: 103 mmol/L (ref 98–110)
Creat: 0.59 mg/dL (ref 0.50–1.10)
GFR, EST AFRICAN AMERICAN: 137 mL/min/{1.73_m2} (ref >=60)
GFR, EST NON AFRICAN AMERICAN: 118 mL/min/{1.73_m2} (ref >=60)
GLUCOSE: 175 mg/dL — AB (ref 65–99)
Globulin: 3.2 g/dL (calc) (ref 1.9–3.7)
POTASSIUM: 4 mmol/L (ref 3.5–5.3)
Sodium: 136 mmol/L (ref 135–146)
TOTAL PROTEIN: 7.4 g/dL (ref 6.1–8.1)
Total Bilirubin: 0.5 mg/dL (ref 0.2–1.2)

## 2018-05-02 LAB — MICROALBUMIN / CREATININE URINE RATIO
Creatinine, Urine: 218 mg/dL (ref 20–275)
MICROALB/CREAT RATIO: 12 ug/mg{creat} (ref <30)
Microalb, Ur: 2.6 mg/dL

## 2018-05-02 LAB — VITAMIN D 25 HYDROXY (VIT D DEFICIENCY, FRACTURES): VIT D 25 HYDROXY: 50 ng/mL (ref 30–100)

## 2018-05-02 LAB — LIPID PANEL
Cholesterol: 199 mg/dL (ref <200)
HDL: 53 mg/dL (ref >50)
LDL Cholesterol (Calc): 124 mg/dL (calc) — ABNORMAL HIGH
NON-HDL CHOLESTEROL (CALC): 146 mg/dL — AB (ref <130)
Total CHOL/HDL Ratio: 3.8 (calc) (ref <5.0)
Triglycerides: 116 mg/dL (ref <150)

## 2018-05-02 LAB — HEMOGLOBIN A1C
HEMOGLOBIN A1C: 7.5 %{Hb} — AB (ref <5.7)
MEAN PLASMA GLUCOSE: 169 (calc)
eAG (mmol/L): 9.3 (calc)

## 2018-05-02 LAB — TSH: TSH: 2.23 m[IU]/L

## 2018-05-22 ENCOUNTER — Other Ambulatory Visit: Payer: Self-pay

## 2018-05-22 MED ORDER — SITAGLIPTIN PHOS-METFORMIN HCL 50-500 MG PO TABS: 1.0000 | ORAL_TABLET | Freq: Two times a day (BID) | ORAL | 1 refills | Status: DC

## 2018-05-22 NOTE — Telephone Encounter (Signed)
Janument rx printed for patient to send off.

## 2018-05-24 NOTE — Patient Instructions (Signed)
It was a pleasure to see you today.  Please take care of herself.  Continue same medications and follow-up in 6 months.  Have flu vaccine through employment.  Please have diabetic eye exam.

## 2018-05-24 NOTE — Progress Notes (Signed)
Subjective:    Patient ID: Rebecca Snyder, female    DOB: 23-Feb-1981, 37 y.o.   MRN: [id number]  HPI 37 year old female presents today for annual physical exam and evaluation of medical issues.  She and her family moved here in the fall 2017 from [place] with her husband and daughter to excepted job with a dialysis center.  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  In December 2017 she had a lower extremity Doppler study of right leg because of pain and edema for 4 weeks.  This result was negative.  History of psoriasis affecting face, ears and elbow.  Uses clobetasol.  Has had issues with right knee pain and swelling.  Used to play soccer and was physically active when she was younger.  Does not recall any specific knee injury.  Issues with dependent edema and has been tried on diuretics.  History of joint pain in her hands.  History of depression and insomnia.  This started around the time of her father passed away several years ago.  She was very close to him.  She takes Effexor.  She takes Ambien every night to sleep as well as melatonin.  She had abdominoplasty and back lift surgery in 2017.  Tonsillectomy 2005, cholecystectomy 2003, C-section 2003, in vitro fertilization 2015.  Gastric sleeve surgery 2012.  She lost about 80 pounds with  gastric sleeve surgery but gained some 50 pounds back around the time of her father's illness and death.  Social history: She is married.  Has a college education.  One teenage daughter.  Husband works for a [employer].  Family history: Father died at age 63 with complications of alpha-1 antitrypsin pulmonary disease and nonalcoholic hepatic cirrhosis.  Mother living in good health in her early 25s.  One brother age 22 who is an alpha-1 antitrypsin carrier.  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  I last saw her in July.  She had not been seen since November 2018  and at that time was found to have diabetes mellitus and was started on Januvia and did quite well.  Has been having to pay out of pocket for Janumet which she takes twice daily.  She is also on Coreg for blood pressure management.  Unfortunately I have no vouchers for Janumet that she can use.  We can try Good Rx card.  She may be able to look online for special offer.  She may look online for mail-order options.  Reminded about annual diabetic eye exam.  Hopes to have better insurance coverage next year.  Needs Prevnar 13 and pneumococcal 23 with diabetes.  Patient will consider.    Review of Systems long-standing history of insomnia and anxiety related to death of her father.  Stable on current regimen.  Work is stressful but she is handling it well.  Family okay.     Objective:   Physical Exam  Constitutional: She is oriented to person, place, and time. She appears well-developed and well-nourished. No distress.  HENT:  Head: Normocephalic and atraumatic.  Right Ear: External ear normal.  Left Ear: External ear normal.  Mouth/Throat: Oropharynx is clear and moist. No oropharyngeal exudate.  Eyes: Pupils are equal, round, and reactive to light. Conjunctivae and EOM are normal. Right eye exhibits no discharge. Left eye exhibits no discharge. No scleral icterus.  Neck: Neck supple. No JVD present. No thyromegaly present.  Cardiovascular: Normal rate, regular  rhythm, normal heart sounds and intact distal pulses.  No murmur heard. Pulmonary/Chest: Effort normal and breath sounds normal. No stridor. No respiratory distress. She has no wheezes. She has no rales.  Abdominal: Soft. Bowel sounds are normal. She exhibits no distension and no mass. There is no tenderness. There is no rebound and no guarding. No hernia.  Genitourinary:  Genitourinary Comments: Deferred to GYN  Musculoskeletal: She exhibits no deformity.  Trace lower extremity edema  Lymphadenopathy:    She has no cervical  adenopathy.  Neurological: She is alert and oriented to person, place, and time. She displays normal reflexes. No cranial nerve deficit or sensory deficit. She exhibits normal muscle tone. Coordination normal.  Skin: Skin is warm and dry. She is not diaphoretic.  Psychiatric: She has a normal mood and affect. Her behavior is normal. Judgment and thought content normal.  Vitals reviewed.         Assessment & Plan:  Long-standing history of anxiety depression  Morbid obesity history of gastric sleeve surgery  Insomnia-takes Ambien nightly  History of vitamin D deficiency  Controlled type 2 diabetes mellitus.  Hemoglobin A1c 7.5%  History of vitamin D deficiency-level is now normal at 50  Plan: Patient is well aware of proper diet for diabetics.  Encourage diet exercise and weight loss.  Continue same medications.  Follow-up in 6 months.  She says she will get flu vaccine through employment.  Controlled type 2 diabetes mellitus

## 2018-05-27 DIAGNOSIS — B370 Candidal stomatitis: Secondary | ICD-10-CM | POA: Diagnosis not present

## 2018-06-21 ENCOUNTER — Other Ambulatory Visit: Payer: Self-pay | Admitting: Internal Medicine

## 2018-07-25 ENCOUNTER — Other Ambulatory Visit: Payer: Self-pay | Admitting: Internal Medicine

## 2018-07-31 DIAGNOSIS — L400 Psoriasis vulgaris: Secondary | ICD-10-CM | POA: Diagnosis not present

## 2018-08-07 DIAGNOSIS — R5382 Chronic fatigue, unspecified: Secondary | ICD-10-CM | POA: Diagnosis not present

## 2018-08-07 DIAGNOSIS — M2550 Pain in unspecified joint: Secondary | ICD-10-CM | POA: Diagnosis not present

## 2018-08-07 DIAGNOSIS — L401 Generalized pustular psoriasis: Secondary | ICD-10-CM | POA: Diagnosis not present

## 2018-08-21 DIAGNOSIS — J069 Acute upper respiratory infection, unspecified: Secondary | ICD-10-CM | POA: Diagnosis not present

## 2018-08-25 DIAGNOSIS — B373 Candidiasis of vulva and vagina: Secondary | ICD-10-CM | POA: Diagnosis not present

## 2018-08-25 DIAGNOSIS — E119 Type 2 diabetes mellitus without complications: Secondary | ICD-10-CM | POA: Diagnosis not present

## 2018-08-25 DIAGNOSIS — J0100 Acute maxillary sinusitis, unspecified: Secondary | ICD-10-CM | POA: Diagnosis not present

## 2018-09-22 ENCOUNTER — Other Ambulatory Visit: Payer: Self-pay | Admitting: Internal Medicine

## 2018-10-02 ENCOUNTER — Encounter: Payer: Self-pay | Admitting: Physician Assistant

## 2018-10-02 ENCOUNTER — Telehealth: Payer: 59 | Admitting: Physician Assistant

## 2018-10-02 DIAGNOSIS — B3731 Acute candidiasis of vulva and vagina: Secondary | ICD-10-CM

## 2018-10-02 DIAGNOSIS — B373 Candidiasis of vulva and vagina: Secondary | ICD-10-CM | POA: Diagnosis not present

## 2018-10-02 MED ORDER — FLUCONAZOLE 150 MG PO TABS: 150.0000 mg | ORAL_TABLET | Freq: Once | ORAL | 0 refills | Status: AC

## 2018-10-02 NOTE — Progress Notes (Signed)
We are sorry that you are not feeling well. Here is how we plan to help! Based on what you shared with me it looks like you: May have a yeast vaginosis  Vaginosis is an inflammation of the vagina that can result in discharge, itching and pain. The cause is usually a change in the normal balance of vaginal bacteria or an infection. Vaginosis can also result from reduced estrogen levels after menopause.  The most common causes of vaginosis are:   Bacterial vaginosis which results from an overgrowth of one on several organisms that are normally present in your vagina.   Yeast infections which are caused by a naturally occurring fungus called candida.   Vaginal atrophy (atrophic vaginosis) which results from the thinning of the vagina from reduced estrogen levels after menopause.   Trichomoniasis which is caused by a parasite and is commonly transmitted by sexual intercourse.  Factors that increase your risk of developing vaginosis include: . Medications, such as antibiotics and steroids . Uncontrolled diabetes . Use of hygiene products such as bubble bath, vaginal spray or vaginal deodorant . Douching . Wearing damp or tight-fitting clothing . Using an intrauterine device (IUD) for birth control . Hormonal changes, such as those associated with pregnancy, birth control pills or menopause . Sexual activity . Having a sexually transmitted infection  Your treatment plan is A single Diflucan (fluconazole) 150mg tablet once.  I have electronically sent this prescription into the pharmacy that you have chosen.  Be sure to take all of the medication as directed. Stop taking any medication if you develop a rash, tongue swelling or shortness of breath. Mothers who are breast feeding should consider pumping and discarding their breast milk while on these antibiotics. However, there is no consensus that infant exposure at these doses would be harmful.  Remember that medication creams can weaken latex  condoms. .   HOME CARE:  Good hygiene may prevent some types of vaginosis from recurring and may relieve some symptoms:  . Avoid baths, hot tubs and whirlpool spas. Rinse soap from your outer genital area after a shower, and dry the area well to prevent irritation. Don't use scented or harsh soaps, such as those with deodorant or antibacterial action. . Avoid irritants. These include scented tampons and pads. . Wipe from front to back after using the toilet. Doing so avoids spreading fecal bacteria to your vagina.  Other things that may help prevent vaginosis include:  . Don't douche. Your vagina doesn't require cleansing other than normal bathing. Repetitive douching disrupts the normal organisms that reside in the vagina and can actually increase your risk of vaginal infection. Douching won't clear up a vaginal infection. . Use a latex condom. Both female and female latex condoms may help you avoid infections spread by sexual contact. . Wear cotton underwear. Also wear pantyhose with a cotton crotch. If you feel comfortable without it, skip wearing underwear to bed. Yeast thrives in moist environments Your symptoms should improve in the next day or two.  GET HELP RIGHT AWAY IF:  . You have pain in your lower abdomen ( pelvic area or over your ovaries) . You develop nausea or vomiting . You develop a fever . Your discharge changes or worsens . You have persistent pain with intercourse . You develop shortness of breath, a rapid pulse, or you faint.  These symptoms could be signs of problems or infections that need to be evaluated by a medical provider now.  MAKE SURE YOU      Understand these instructions.  Will watch your condition.  Will get help right away if you are not doing well or get worse.  Your e-visit answers were reviewed by a board certified advanced clinical practitioner to complete your personal care plan. Depending upon the condition, your plan could have included  both over the counter or prescription medications. Please review your pharmacy choice to make sure that you have choses a pharmacy that is open for you to pick up any needed prescription, Your safety is important to [place]. If you have drug allergies check your prescription carefully.   You can use MyChart to ask questions about today's visit, request a non-urgent call back, or ask for a work or school excuse for 24 hours related to this e-Visit. If it has been greater than 24 hours you will need to follow up with your provider, or enter a new e-Visit to address those concerns. You will get a MyChart message within the next two days asking about your experience. I hope that your e-visit has been valuable and will speed your recovery.  I have spent 7 min to complete this note- Ill[facility] Level [facility]

## 2018-10-20 ENCOUNTER — Other Ambulatory Visit: Payer: Self-pay | Admitting: Internal Medicine

## 2018-11-07 ENCOUNTER — Other Ambulatory Visit: Payer: Self-pay

## 2018-11-07 ENCOUNTER — Other Ambulatory Visit (INDEPENDENT_AMBULATORY_CARE_PROVIDER_SITE_OTHER): Payer: 59 | Admitting: Internal Medicine

## 2018-11-07 DIAGNOSIS — E1121 Type 2 diabetes mellitus with diabetic nephropathy: Secondary | ICD-10-CM | POA: Diagnosis not present

## 2018-11-08 LAB — MICROALBUMIN / CREATININE URINE RATIO
Creatinine, Urine: 232 mg/dL (ref 20–275)
Microalb Creat Ratio: 22 mcg/mg creat (ref <30)
Microalb, Ur: 5.1 mg/dL

## 2018-11-08 LAB — HEMOGLOBIN A1C
Hgb A1c MFr Bld: 7.1 % of total Hgb — ABNORMAL HIGH (ref <5.7)
Mean Plasma Glucose: 157 (calc)
eAG (mmol/L): 8.7 (calc)

## 2018-11-09 ENCOUNTER — Ambulatory Visit (INDEPENDENT_AMBULATORY_CARE_PROVIDER_SITE_OTHER): Payer: 59 | Admitting: Internal Medicine

## 2018-11-09 ENCOUNTER — Encounter: Payer: Self-pay | Admitting: Internal Medicine

## 2018-11-09 DIAGNOSIS — Z6837 Body mass index (BMI) 37.0-37.9, adult: Secondary | ICD-10-CM | POA: Diagnosis not present

## 2018-11-09 DIAGNOSIS — Z8659 Personal history of other mental and behavioral disorders: Secondary | ICD-10-CM

## 2018-11-09 DIAGNOSIS — Z872 Personal history of diseases of the skin and subcutaneous tissue: Secondary | ICD-10-CM

## 2018-11-09 DIAGNOSIS — E1121 Type 2 diabetes mellitus with diabetic nephropathy: Secondary | ICD-10-CM | POA: Diagnosis not present

## 2018-11-09 DIAGNOSIS — G4709 Other insomnia: Secondary | ICD-10-CM | POA: Diagnosis not present

## 2018-11-09 DIAGNOSIS — R609 Edema, unspecified: Secondary | ICD-10-CM | POA: Diagnosis not present

## 2018-11-09 DIAGNOSIS — L309 Dermatitis, unspecified: Secondary | ICD-10-CM

## 2018-11-09 DIAGNOSIS — Z9884 Bariatric surgery status: Secondary | ICD-10-CM

## 2018-11-09 MED ORDER — SITAGLIPTIN PHOS-METFORMIN HCL 50-500 MG PO TABS: ORAL_TABLET | ORAL | 5 refills | Status: DC

## 2018-11-09 MED ORDER — CARVEDILOL 12.5 MG PO TABS: 12.5000 mg | ORAL_TABLET | Freq: Every day | ORAL | 1 refills | Status: DC

## 2018-11-09 MED ORDER — VENLAFAXINE HCL ER 75 MG PO CP24: 75.0000 mg | ORAL_CAPSULE | Freq: Every day | ORAL | 1 refills | Status: DC

## 2018-11-09 MED ORDER — TRIAMTERENE-HCTZ 75-50 MG PO TABS: ORAL_TABLET | ORAL | 5 refills | Status: DC

## 2018-11-22 ENCOUNTER — Telehealth: Payer: 59 | Admitting: Family

## 2018-11-22 DIAGNOSIS — B373 Candidiasis of vulva and vagina: Secondary | ICD-10-CM

## 2018-11-22 DIAGNOSIS — B3731 Acute candidiasis of vulva and vagina: Secondary | ICD-10-CM

## 2018-11-22 MED ORDER — FLUCONAZOLE 150 MG PO TABS: 150.0000 mg | ORAL_TABLET | ORAL | 0 refills | Status: DC | PRN

## 2018-11-22 NOTE — Progress Notes (Signed)
   Subjective:    Patient ID: Rebecca Snyder, female    DOB: 03-12-1981, 38 y.o.   MRN: [id number]  HPI 38 year old Female seen today by interactive audio and video telecommunications due to the Coronavirus pandemic.  She consents to visit in this format today.  She has a history of depression, anxiety, and type 2 diabetes mellitus.  Was seen for routine physical examination in October 2019.  History of dependent edema.  History of depression and insomnia.  She takes Ambien every night to sleep.  She takes Effexor.  Issues started around the time of the death of her father.  She has a history of vitamin D deficiency.  She reportedly takes vitamin D supplement over-the-counter.  History of gastric sleeve surgery for morbid obesity.  Hemoglobin A1c 7.1% on Janumet 50/500 twice daily.  Takes Maxide 75 for dependent edema.  Takes Coreg for hypertension and reports labile blood pressure.  Now on Otezla for psoriasis.  In October she had elevated LDL of 124.  She currently is not on statin medication but we may need to consider that at next physical exam.    Review of Systems  Respiratory: Negative.   Cardiovascular:       History of dependent edema  Gastrointestinal: Negative.   Genitourinary: Negative.   Neurological: Negative.   Psychiatric/Behavioral:       History of anxiety depression and insomnia       Objective:   Physical Exam  Patient seen today by virtual visit.  Reports that blood pressure is stable and glucose is under fairly good control.      Assessment & Plan:  Controlled type 2 diabetes mellitus.  Would prefer to see hemoglobin A1c less than 7%.  Follow-up in 6 months.  Continue Janumet twice daily.  Hyperlipidemia- reevaluate at time of physical exam in 6 months.  Really needs to be on statin therapy if LDL remains elevated  Anxiety depression and insomnia-continue current medications.  Psoriasis- now on Otezla  Obesity-continue to monitor weight.   Encouraged diet and exercise.  History of gastric sleeve surgery.  Hypertension-treated with Coreg  Plan: Follow-up in 6 months.  Encourage diet exercise and weight loss.  Watch Accu-Cheks.  Continue same antianxiety medications.

## 2018-11-22 NOTE — Progress Notes (Signed)
We are sorry that you are not feeling well. Here is how we plan to help! Based on what you shared with me it looks like you: May have a yeast vaginosis  Vaginosis is an inflammation of the vagina that can result in discharge, itching and pain. The cause is usually a change in the normal balance of vaginal bacteria or an infection. Vaginosis can also result from reduced estrogen levels after menopause.  Approximately 5 minutes was spent documenting and reviewing patient's chart.   The most common causes of vaginosis are:   Bacterial vaginosis which results from an overgrowth of one on several organisms that are normally present in your vagina.   Yeast infections which are caused by a naturally occurring fungus called candida.   Vaginal atrophy (atrophic vaginosis) which results from the thinning of the vagina from reduced estrogen levels after menopause.   Trichomoniasis which is caused by a parasite and is commonly transmitted by sexual intercourse.  Factors that increase your risk of developing vaginosis include: . Medications, such as antibiotics and steroids . Uncontrolled diabetes . Use of hygiene products such as bubble bath, vaginal spray or vaginal deodorant . Douching . Wearing damp or tight-fitting clothing . Using an intrauterine device (IUD) for birth control . Hormonal changes, such as those associated with pregnancy, birth control pills or menopause . Sexual activity . Having a sexually transmitted infection  Your treatment plan is A single Diflucan (fluconazole) 150mg tablet once.  I have electronically sent this prescription into the pharmacy that you have chosen.  Be sure to take all of the medication as directed. Stop taking any medication if you develop a rash, tongue swelling or shortness of breath. Mothers who are breast feeding should consider pumping and discarding their breast milk while on these antibiotics. However, there is no consensus that infant exposure at  these doses would be harmful.  Remember that medication creams can weaken latex condoms. .   HOME CARE:  Good hygiene may prevent some types of vaginosis from recurring and may relieve some symptoms:  . Avoid baths, hot tubs and whirlpool spas. Rinse soap from your outer genital area after a shower, and dry the area well to prevent irritation. Don't use scented or harsh soaps, such as those with deodorant or antibacterial action. . Avoid irritants. These include scented tampons and pads. . Wipe from front to back after using the toilet. Doing so avoids spreading fecal bacteria to your vagina.  Other things that may help prevent vaginosis include:  . Don't douche. Your vagina doesn't require cleansing other than normal bathing. Repetitive douching disrupts the normal organisms that reside in the vagina and can actually increase your risk of vaginal infection. Douching won't clear up a vaginal infection. . Use a latex condom. Both female and female latex condoms may help you avoid infections spread by sexual contact. . Wear cotton underwear. Also wear pantyhose with a cotton crotch. If you feel comfortable without it, skip wearing underwear to bed. Yeast thrives in moist environments Your symptoms should improve in the next day or two.  GET HELP RIGHT AWAY IF:  . You have pain in your lower abdomen ( pelvic area or over your ovaries) . You develop nausea or vomiting . You develop a fever . Your discharge changes or worsens . You have persistent pain with intercourse . You develop shortness of breath, a rapid pulse, or you faint.  These symptoms could be signs of problems or infections that need to be   evaluated by a medical provider now.  MAKE SURE YOU    Understand these instructions.  Will watch your condition.  Will get help right away if you are not doing well or get worse.  Your e-visit answers were reviewed by a board certified advanced clinical practitioner to complete your  personal care plan. Depending upon the condition, your plan could have included both over the counter or prescription medications. Please review your pharmacy choice to make sure that you have choses a pharmacy that is open for you to pick up any needed prescription, Your safety is important to us. If you have drug allergies check your prescription carefully.   You can use MyChart to ask questions about today's visit, request a non-urgent call back, or ask for a work or school excuse for 24 hours related to this e-Visit. If it has been greater than 24 hours you will need to follow up with your provider, or enter a new e-Visit to address those concerns. You will get a MyChart message within the next two days asking about your experience. I hope that your e-visit has been valuable and will speed your recovery.  

## 2018-11-22 NOTE — Patient Instructions (Signed)
Continue to watch Accu-Cheks and take current medications.  Continue same antianxiety medications as previously prescribed.  Watch diet.  May need statin medication if lipids still elevated at time of physical exam in 6 months.  Watch blood pressure.

## 2019-02-05 ENCOUNTER — Ambulatory Visit (INDEPENDENT_AMBULATORY_CARE_PROVIDER_SITE_OTHER): Payer: 59 | Admitting: Internal Medicine

## 2019-02-05 ENCOUNTER — Telehealth: Payer: Self-pay | Admitting: Internal Medicine

## 2019-02-05 DIAGNOSIS — M5430 Sciatica, unspecified side: Secondary | ICD-10-CM | POA: Diagnosis not present

## 2019-02-05 NOTE — Telephone Encounter (Signed)
Scheduled Virtual Visit °

## 2019-02-05 NOTE — Telephone Encounter (Signed)
[name] [phone]  Rebecca Snyder called to say she was having lower back pain for the last week and a half. She would like a virtual visit or for you to call in a steroid. She stated this usually helps.

## 2019-02-05 NOTE — Telephone Encounter (Signed)
OK 

## 2019-02-20 ENCOUNTER — Encounter: Payer: Self-pay | Admitting: Internal Medicine

## 2019-02-20 NOTE — Patient Instructions (Signed)
Take prednisone as directed and tapering course for back pain

## 2019-02-20 NOTE — Progress Notes (Signed)
   Subjective:    Patient ID: Calla Wedekind, female    DOB: [birth year], 38 y.o.   MRN: [id number]  HPI Patient seen today by interactive audio and video telecommunications due to the coronavirus pandemic.  She is agreeable to visit in this format today and is identified using 2 identifiers as Mykel Mohl, a patient in this practice.  Patient has history of controlled type 2 diabetes mellitus.  She is monitoring her Accu-Cheks regularly.   Review of Systems no new complaints except for back pain which has been present before but not recently Does not recall any specific injury.    Objective:   Physical Exam  Unable to examine but she gives a clear concise history appears to have sciatica based on history symptoms.  Does not have paresthesias in her leg      Assessment & Plan:  Sciatica  Plan: Prednisone 10 mg tablets 1 to go from 60 mg to 0 mg over 7 days.  Call if symptoms do not improve.

## 2019-02-26 ENCOUNTER — Encounter: Payer: Self-pay | Admitting: Internal Medicine

## 2019-02-26 ENCOUNTER — Telehealth: Payer: Self-pay | Admitting: Internal Medicine

## 2019-02-26 MED ORDER — PREDNISONE 10 MG PO TABS: ORAL_TABLET | ORAL | 0 refills | Status: DC

## 2019-02-26 NOTE — Telephone Encounter (Signed)
Pt had Doxy visit for sciatica July 13. Back improved but we forgot to call in Prednisone tapering course for her. She had a virtual visit with daughter today and told me this. We will call in Prednisone now for her to have on hand in case sciatica recurs.

## 2019-04-06 ENCOUNTER — Ambulatory Visit: Payer: 59 | Admitting: Obstetrics and Gynecology

## 2019-04-06 ENCOUNTER — Other Ambulatory Visit: Payer: Self-pay

## 2019-04-06 NOTE — Telephone Encounter (Signed)
Patient is traveling to [place] for 2 weeks and in a week her Lorrin Mais is going to expire in a week she wants to know if [facility] [place] bc [facility]'t [facility] since it is too early.

## 2019-04-08 NOTE — Telephone Encounter (Signed)
I cannot call a controlled substance across state lines. She is going to have to work this out with local pharmacy

## 2019-04-09 NOTE — Telephone Encounter (Signed)
LVM that Dr Renold Genta can not call controlled substance across state lines.

## 2019-04-12 ENCOUNTER — Other Ambulatory Visit: Payer: Self-pay | Admitting: Internal Medicine

## 2019-04-12 NOTE — Telephone Encounter (Signed)
Sent in 30 with one refill

## 2019-04-12 NOTE — Telephone Encounter (Signed)
Pt wants to know if we can just send it to [facility] pharmacy tonight[facility] [address]

## 2019-05-08 ENCOUNTER — Other Ambulatory Visit: Payer: 59 | Admitting: Internal Medicine

## 2019-05-11 ENCOUNTER — Encounter: Payer: 59 | Admitting: Internal Medicine

## 2019-05-22 ENCOUNTER — Ambulatory Visit: Payer: 59 | Admitting: Obstetrics and Gynecology

## 2019-06-12 ENCOUNTER — Other Ambulatory Visit: Payer: Self-pay | Admitting: Internal Medicine

## 2019-08-01 ENCOUNTER — Other Ambulatory Visit: Payer: Self-pay

## 2019-08-01 NOTE — Progress Notes (Signed)
39 y.o. [id number] Married Caucasian female here for annual exam.    Patient scheduled for some dental surgery 08-13-19 and is now on Amoxicillin. Patient now with vaginal irritation. Has not used any OTC medication.   Just finished her period, so having some discharge from that.  No odor.   Menses are regular, monthly.  Would welcome pregnancy.  Patient has lost 20lbs!! Starting to exercise.  Has her A1C scheduled for next week.  PCP:  Luanna Cole.[place]   Patient's last menstrual period was 07/27/2019 (exact date).           Sexually active: Yes.    The current method of family planning is none.    Exercising: Yes.    exercising through virtual headset. Smoker:  no  Health Maintenance: Pap: 03-18-17 Neg:Neg HR HPV.  States her prior pap was 2 years prior to that. History of abnormal Pap:  Yes, Hx of colposcopy years ago but no treatment. MMG:  n/a Colonoscopy:  n/a BMD:   n/a  Result  n/a TDaP: with PCP Gardasil:   no HIV: unsure when but Neg Hep C:unsure when but neg Screening Labs:  PCP.  Flu vaccine:  Completed.  Covid 19 vaccine:  Pfizer, first vaccine completed.   reports that she has quit smoking. She has never used smokeless tobacco. She reports that she does not drink alcohol or use drugs.  Past Medical History:  Diagnosis Date  . Amenorrhea   . Depression   . Elevated liver enzymes   . Hormone disorder   . Infertility, female    IVF  . PCOS (polycystic ovarian syndrome) 1996  . Psoriasis     Past Surgical History:  Procedure Laterality Date  . BACK SURGERY  2013   laminectomy/discectomy  . CESAREAN SECTION    . CHOLECYSTECTOMY    . COSMETIC SURGERY     tummy tuck and back lift  . LAPAROSCOPIC GASTRIC SLEEVE RESECTION    . TONSILLECTOMY      Current Outpatient Medications  Medication Sig Dispense Refill  . amoxicillin-clavulanate (AUGMENTIN) 875-125 MG tablet Take 1 tablet by mouth 3 (three) times daily.    . carvedilol (COREG) 12.5 MG tablet Take 1  tablet (12.5 mg total) by mouth daily. 90 tablet 1  . cholecalciferol (VITAMIN D) 1000 units tablet Take 4,000 Units by mouth daily.    . clobetasol (TEMOVATE) 0.05 % external solution Apply 1 application topically 2 (two) times daily. 50 mL 1  . clobetasol (TEMOVATE) 0.05 % external solution APPLY 1 APPLICATION TOPICALLY AS NEEDED. 50 mL 1  . clobetasol ointment (TEMOVATE) 0.05 % Apply topically 2 (two) times daily. 30 g 5  . Melatonin 10 MG CAPS Take by mouth.    . Multiple Vitamins-Minerals (MULTIVITAMIN PO) Take by mouth daily.    . ONE TOUCH ULTRA TEST test strip USE AS DIRECTED 4 TIMES DAILY 100 each prn  . ONETOUCH DELICA LANCETS 33G MISC USE AS DIRECTED 4 TIMES DAILY 100 each 0  . OTEZLA 30 MG TABS Take 1 tablet by mouth daily.    . sitaGLIPtin-metformin (JANUMET) 50-500 MG tablet TAKE 1 TABLET BY MOUTH 2 (TWO) TIMES DAILY WITH A MEAL. 60 tablet 5  . venlafaxine XR (EFFEXOR-XR) 75 MG 24 hr capsule TAKE 1 CAPSULE BY MOUTH EVERY DAY 90 capsule 1  . zolpidem (AMBIEN) 10 MG tablet TAKE 1 TABLET BY MOUTH EVERYDAY AT BEDTIME 30 tablet 1  . coal tar-salicylic acid 2 % shampoo Apply topically daily as needed for  itching. 120 mL 12   No current facility-administered medications for this visit.    Family History  Problem Relation Age of Onset  . Hypertension Mother        improved after weight loss  . Diabetes Mother        better after weight loss  . Liver disease Father        non-alcoholic, caused by alpha-1 antitrypsin deficiency  . Alpha-1 antitrypsin deficiency Father   . Lung cancer Maternal Grandmother   . Heart attack Maternal Grandfather     Review of Systems  All other systems reviewed and are negative.   Exam:   BP 118/76 (Cuff Size: Large)   Pulse 84   Temp (!) 97.3 F (36.3 C) (Temporal)   Resp 14   Ht 5\' 9"  (1.753 m)   Wt 231 lb 12.8 oz (105.1 kg)   LMP 07/27/2019 (Exact Date)   BMI 34.23 kg/m     General appearance: alert, cooperative and appears stated  age Head: normocephalic, without obvious abnormality, atraumatic Neck: no adenopathy, supple, symmetrical, trachea midline and thyroid normal to inspection and palpation Lungs: clear to auscultation bilaterally Breasts: normal appearance, no masses or tenderness, No nipple retraction or dimpling, No nipple discharge or bleeding, No axillary adenopathy Heart: regular rate and rhythm Abdomen: soft, non-tender; no masses, no organomegaly Extremities: extremities normal, atraumatic, no cyanosis or edema Skin: skin color, texture, turgor normal. No rashes or lesions Lymph nodes: cervical, supraclavicular, and axillary nodes normal. Neurologic: grossly normal  Pelvic: External genitalia: greyish color to the vulva.              No abnormal inguinal nodes palpated.              Urethra:  normal appearing urethra with no masses, tenderness or lesions              Bartholins and Skenes: normal                 Vagina: normal appearing vagina with normal color and discharge, no lesions              Cervix: no lesions.  Light menstrual flow.               Pap taken: No. Bimanual Exam:  Uterus:  normal size, contour, position, consistency, mobility, non-tender              Adnexa: no mass, fullness, tenderness           Chaperone was present for exam.  Assessment:   Well woman visit with normal exam. Hx PCOS.  Remote hx abnormal pap. Hx infertility. Would welcome a pregnancy.  Hx weight loss surgery.  Hx abdominoplasty.  Hx recurrent BV. Vulvovaginitis.  DM.  Psoriasis.   Plan: Mammogram screening discussed. Self breast awareness reviewed. Pap and HR HPV in 2023.   We discussed new guidelines.  Guidelines for Calcium, Vitamin D, regular exercise program including cardiovascular and weight bearing exercise. PNV.  Affirm.  Rx for Mycolog II.  We discussed Gardasil vaccine.   She will consider.  She will review her medications with her PCP as she is not currently preventing  pregnancy.  Follow up annually and prn.   After visit summary provided.

## 2019-08-02 ENCOUNTER — Encounter: Payer: Self-pay | Admitting: Obstetrics and Gynecology

## 2019-08-02 ENCOUNTER — Ambulatory Visit: Payer: 59 | Admitting: Obstetrics and Gynecology

## 2019-08-02 VITALS — BP 118/76 | HR 84 | Temp 97.3°F | Resp 14 | Ht 69.0 in | Wt 231.8 lb

## 2019-08-02 DIAGNOSIS — Z01419 Encounter for gynecological examination (general) (routine) without abnormal findings: Secondary | ICD-10-CM

## 2019-08-02 DIAGNOSIS — N760 Acute vaginitis: Secondary | ICD-10-CM

## 2019-08-02 MED ORDER — NYSTATIN-TRIAMCINOLONE 100000-0.1 UNIT/GM-% EX CREA: 1.0000 "application " | TOPICAL_CREAM | Freq: Two times a day (BID) | CUTANEOUS | 0 refills | Status: DC

## 2019-08-02 NOTE — Patient Instructions (Addendum)
EXERCISE AND DIET:  We recommended that you start or continue a regular exercise program for good health. Regular exercise means any activity that makes your heart beat faster and makes you sweat.  We recommend exercising at least 30 minutes per day at least 3 days a week, preferably 4 or 5.  We also recommend a diet low in fat and sugar.  Inactivity, poor dietary choices and obesity can cause diabetes, heart attack, stroke, and kidney damage, among others.    ALCOHOL AND SMOKING:  Women should limit their alcohol intake to no more than 7 drinks/beers/glasses of wine (combined, not each!) per week. Moderation of alcohol intake to this level decreases your risk of breast cancer and liver damage. And of course, no recreational drugs are part of a healthy lifestyle.  And absolutely no smoking or even second hand smoke. Most people know smoking can cause heart and lung diseases, but did you know it also contributes to weakening of your bones? Aging of your skin?  Yellowing of your teeth and nails?  CALCIUM AND VITAMIN D:  Adequate intake of calcium and Vitamin D are recommended.  The recommendations for exact amounts of these supplements seem to change often, but generally speaking 600 mg of calcium (either carbonate or citrate) and 800 units of Vitamin D per day seems prudent. Certain women may benefit from higher intake of Vitamin D.  If you are among these women, your doctor will have told you during your visit.    PAP SMEARS:  Pap smears, to check for cervical cancer or precancers,  have traditionally been done yearly, although recent scientific advances have shown that most women can have pap smears less often.  However, every woman still should have a physical exam from her gynecologist every year. It will include a breast check, inspection of the vulva and vagina to check for abnormal growths or skin changes, a visual exam of the cervix, and then an exam to evaluate the size and shape of the uterus and  ovaries.  And after 38 years of age, a rectal exam is indicated to check for rectal cancers. We will also provide age appropriate advice regarding health maintenance, like when you should have certain vaccines, screening for sexually transmitted diseases, bone density testing, colonoscopy, mammograms, etc.   MAMMOGRAMS:  All women over 40 years old should have a yearly mammogram. Many facilities now offer a "3D" mammogram, which may cost around $50 extra out of pocket. If possible,  we recommend you accept the option to have the 3D mammogram performed.  It both reduces the number of women who will be called back for extra views which then turn out to be normal, and it is better than the routine mammogram at detecting truly abnormal areas.    COLONOSCOPY:  Colonoscopy to screen for colon cancer is recommended for all women at age 50.  We know, you hate the idea of the prep.  We agree, BUT, having colon cancer and not knowing it is worse!!  Colon cancer so often starts as a polyp that can be seen and removed at colonscopy, which can quite literally save your life!  And if your first colonoscopy is normal and you have no family history of colon cancer, most women don't have to have it again for 10 years.  Once every ten years, you can do something that may end up saving your life, right?  We will be happy to help you get it scheduled when you are ready.    Be sure to check your insurance coverage so you understand how much it will cost.  It may be covered as a preventative service at no cost, but you should check your particular policy.     HPV (Human Papillomavirus) Vaccine: What You Need to Know 1. Why get vaccinated? HPV (Human papillomavirus) vaccine can prevent infection with some types of human papillomavirus. HPV infections can cause certain types of cancers including:  cervical, vaginal and vulvar cancers in women,  penile cancer in men, and  anal cancers in both men and women. HPV vaccine  prevents infection from the HPV types that cause over 90% of these cancers. HPV is spread through intimate skin-to-skin or sexual contact. HPV infections are so common that nearly all men and women will get at least one type of HPV at some time in their lives. Most HPV infections go away by themselves within 2 years. But sometimes HPV infections will last longer and can cause cancers later in life. 2. HPV vaccine HPV vaccine is routinely recommended for adolescents at 37 or 38 years of age to ensure they are protected before they are exposed to the virus. HPV vaccine may be given beginning at age 8 years, and as late as age 61 years. Most people older than 26 years will not benefit from HPV vaccination. Talk with your health care provider if you want more information. Most children who get the first dose before 65 years of age need 2 doses of HPV vaccine. Anyone who gets the first dose on or after 38 years of age, and younger people with certain immunocompromising conditions, need 3 doses. Your health care provider can give you more information. HPV vaccine may be given at the same time as other vaccines. 3. Talk with your health care provider Tell your vaccine provider if the person getting the vaccine:  Has had an allergic reaction after a previous dose of HPV vaccine, or has any severe, life-threatening allergies.  Is pregnant. In some cases, your health care provider may decide to postpone HPV vaccination to a future visit. People with minor illnesses, such as a cold, may be vaccinated. People who are moderately or severely ill should usually wait until they recover before getting HPV vaccine. Your health care provider can give you more information. 4. Risks of a vaccine reaction  Soreness, redness, or swelling where the shot is given can happen after HPV vaccine.  Fever or headache can happen after HPV vaccine. People sometimes faint after medical procedures, including vaccination. Tell  your provider if you feel dizzy or have vision changes or ringing in the ears. As with any medicine, there is a very remote chance of a vaccine causing a severe allergic reaction, other serious injury, or death. 5. What if there is a serious problem? An allergic reaction could occur after the vaccinated person leaves the clinic. If you see signs of a severe allergic reaction (hives, swelling of the face and throat, difficulty breathing, a fast heartbeat, dizziness, or weakness), call 9-1-1 and get the person to the nearest hospital. For other signs that concern you, call your health care provider. Adverse reactions should be reported to the Vaccine Adverse Event Reporting System (VAERS). Your health care provider will usually file this report, or you can do it yourself. Visit the VAERS website at www.vaers.SamedayNews.es or call (934) 820-0964. VAERS is only for reporting reactions, and VAERS staff do not give medical advice. 6. The National Vaccine Injury Compensation Program The [employer] Vaccine Injury Compensation Program (  VICP) is a federal program that was created to compensate people who may have been injured by certain vaccines. Visit the VICP website at www.hrsa.gov/vaccinecompensation or call 1-800-338-2382 to learn about the program and about filing a claim. There is a time limit to file a claim for compensation. 7. How can I learn more?  Ask your health care provider.  Call your local or state health department.  Contact the Centers for Disease Control and Prevention (CDC): ? Call 1-800-232-4636 (1-800-CDC-INFO) or ? Visit CDC's website at www.cdc.gov/vaccines Vaccine Information Statement HPV Vaccine (05/24/2018) This information is not intended to replace advice given to you by your health care provider. Make sure you discuss any questions you have with your health care provider. Document Revised: 10/31/2018 Document Reviewed: 02/21/2018 Elsevier Patient Education  2020 Elsevier Inc.  

## 2019-08-03 ENCOUNTER — Other Ambulatory Visit: Payer: Self-pay | Admitting: *Deleted

## 2019-08-03 LAB — VAGINITIS/VAGINOSIS, DNA PROBE
Candida Species: POSITIVE — AB
Gardnerella vaginalis: POSITIVE — AB
Trichomonas vaginosis: NEGATIVE

## 2019-08-03 MED ORDER — METRONIDAZOLE 500 MG PO TABS: 500.0000 mg | ORAL_TABLET | Freq: Two times a day (BID) | ORAL | 0 refills | Status: DC

## 2019-08-03 MED ORDER — FLUCONAZOLE 150 MG PO TABS: ORAL_TABLET | ORAL | 0 refills | Status: DC

## 2019-08-06 ENCOUNTER — Other Ambulatory Visit: Payer: 59 | Admitting: Internal Medicine

## 2019-08-06 ENCOUNTER — Other Ambulatory Visit: Payer: Self-pay

## 2019-08-06 DIAGNOSIS — Z1329 Encounter for screening for other suspected endocrine disorder: Secondary | ICD-10-CM

## 2019-08-06 DIAGNOSIS — Z0000 Encounter for general adult medical examination without abnormal findings: Secondary | ICD-10-CM

## 2019-08-06 DIAGNOSIS — L309 Dermatitis, unspecified: Secondary | ICD-10-CM

## 2019-08-06 DIAGNOSIS — Z8659 Personal history of other mental and behavioral disorders: Secondary | ICD-10-CM

## 2019-08-06 DIAGNOSIS — G4709 Other insomnia: Secondary | ICD-10-CM

## 2019-08-06 DIAGNOSIS — Z1321 Encounter for screening for nutritional disorder: Secondary | ICD-10-CM

## 2019-08-06 DIAGNOSIS — E1121 Type 2 diabetes mellitus with diabetic nephropathy: Secondary | ICD-10-CM

## 2019-08-07 LAB — COMPLETE METABOLIC PANEL WITH GFR
AG Ratio: 1.1 (calc) (ref 1.0–2.5)
ALT: 23 U/L (ref 6–29)
AST: 20 U/L (ref 10–30)
Albumin: 4.2 g/dL (ref 3.6–5.1)
Alkaline phosphatase (APISO): 82 U/L (ref 31–125)
BUN: 9 mg/dL (ref 7–25)
CO2: 25 mmol/L (ref 20–32)
Calcium: 9.7 mg/dL (ref 8.6–10.2)
Chloride: 102 mmol/L (ref 98–110)
Creat: 0.66 mg/dL (ref 0.50–1.10)
GFR, Est African American: 130 mL/min/{1.73_m2} (ref >=60)
GFR, Est Non African American: 112 mL/min/{1.73_m2} (ref >=60)
Globulin: 3.7 g/dL (calc) (ref 1.9–3.7)
Glucose, Bld: 126 mg/dL — ABNORMAL HIGH (ref 65–99)
Potassium: 3.9 mmol/L (ref 3.5–5.3)
Sodium: 138 mmol/L (ref 135–146)
Total Bilirubin: 0.5 mg/dL (ref 0.2–1.2)
Total Protein: 7.9 g/dL (ref 6.1–8.1)

## 2019-08-07 LAB — LIPID PANEL
Cholesterol: 183 mg/dL (ref <200)
HDL: 53 mg/dL (ref >=50)
LDL Cholesterol (Calc): 109 mg/dL (calc) — ABNORMAL HIGH
Non-HDL Cholesterol (Calc): 130 mg/dL (calc) — ABNORMAL HIGH (ref <130)
Total CHOL/HDL Ratio: 3.5 (calc) (ref <5.0)
Triglycerides: 104 mg/dL (ref <150)

## 2019-08-07 LAB — CBC WITH DIFFERENTIAL/PLATELET
Absolute Monocytes: 422 cells/uL (ref 200–950)
Basophils Absolute: 26 cells/uL (ref 0–200)
Basophils Relative: 0.3 %
Eosinophils Absolute: 88 cells/uL (ref 15–500)
Eosinophils Relative: 1 %
HCT: 43.9 % (ref 35.0–45.0)
Hemoglobin: 14.9 g/dL (ref 11.7–15.5)
Lymphs Abs: 2376 cells/uL (ref 850–3900)
MCH: 31.4 pg (ref 27.0–33.0)
MCHC: 33.9 g/dL (ref 32.0–36.0)
MCV: 92.4 fL (ref 80.0–100.0)
MPV: 9.1 fL (ref 7.5–12.5)
Monocytes Relative: 4.8 %
Neutro Abs: 5887 cells/uL (ref 1500–7800)
Neutrophils Relative %: 66.9 %
Platelets: 293 10*3/uL (ref 140–400)
RBC: 4.75 10*6/uL (ref 3.80–5.10)
RDW: 12.1 % (ref 11.0–15.0)
Total Lymphocyte: 27 %
WBC: 8.8 10*3/uL (ref 3.8–10.8)

## 2019-08-07 LAB — HEMOGLOBIN A1C
Hgb A1c MFr Bld: 6.5 % of total Hgb — ABNORMAL HIGH (ref <5.7)
Mean Plasma Glucose: 140 (calc)
eAG (mmol/L): 7.7 (calc)

## 2019-08-07 LAB — TSH: TSH: 1.32 mIU/L

## 2019-08-09 ENCOUNTER — Ambulatory Visit (INDEPENDENT_AMBULATORY_CARE_PROVIDER_SITE_OTHER): Payer: 59 | Admitting: Internal Medicine

## 2019-08-09 ENCOUNTER — Encounter: Payer: Self-pay | Admitting: Internal Medicine

## 2019-08-09 ENCOUNTER — Other Ambulatory Visit: Payer: Self-pay

## 2019-08-09 VITALS — BP 98/60 | HR 87 | Temp 98.0°F | Ht 69.0 in | Wt 230.0 lb

## 2019-08-09 DIAGNOSIS — Z0000 Encounter for general adult medical examination without abnormal findings: Secondary | ICD-10-CM | POA: Diagnosis not present

## 2019-08-09 DIAGNOSIS — Z9884 Bariatric surgery status: Secondary | ICD-10-CM

## 2019-08-09 DIAGNOSIS — R8290 Unspecified abnormal findings in urine: Secondary | ICD-10-CM

## 2019-08-09 DIAGNOSIS — G4709 Other insomnia: Secondary | ICD-10-CM

## 2019-08-09 DIAGNOSIS — R609 Edema, unspecified: Secondary | ICD-10-CM

## 2019-08-09 DIAGNOSIS — Z6833 Body mass index (BMI) 33.0-33.9, adult: Secondary | ICD-10-CM

## 2019-08-09 DIAGNOSIS — E1121 Type 2 diabetes mellitus with diabetic nephropathy: Secondary | ICD-10-CM

## 2019-08-09 DIAGNOSIS — I10 Essential (primary) hypertension: Secondary | ICD-10-CM

## 2019-08-09 DIAGNOSIS — Z8659 Personal history of other mental and behavioral disorders: Secondary | ICD-10-CM

## 2019-08-09 DIAGNOSIS — Z872 Personal history of diseases of the skin and subcutaneous tissue: Secondary | ICD-10-CM

## 2019-08-09 LAB — POCT URINALYSIS DIPSTICK
Bilirubin, UA: NEGATIVE
Blood, UA: NEGATIVE
Glucose, UA: POSITIVE — AB
Ketones, UA: NEGATIVE
Nitrite, UA: NEGATIVE
Protein, UA: POSITIVE — AB
Spec Grav, UA: 1.01 (ref 1.010–1.025)
Urobilinogen, UA: 0.2 E.U./dL
pH, UA: 6 (ref 5.0–8.0)

## 2019-08-09 MED ORDER — CARVEDILOL 25 MG PO TABS: 25.0000 mg | ORAL_TABLET | Freq: Two times a day (BID) | ORAL | 5 refills | Status: DC

## 2019-08-09 NOTE — Progress Notes (Deleted)
   Subjective:    Patient ID: Rebecca Snyder, female    DOB: Jan 18, 1981, 39 y.o.   MRN: [id number]  HPI 38 year old Female for health maintenance exam and evaluation of medical issues.  Doing well through the pandemic.Work is busy.Has had one Covid-19 vaccine.  She was in [place] several weeks ago taking care of her mother who had COVID-19 however, the patient never came down with it.  Patient her daughter and husband moved here in the fall 2017 from [place].  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  In December 2017 she had lower extremity Doppler study right leg because of pain and edema for 4 weeks.  This was negative.  History of psoriasis affecting face, ears and elbow and uses clobetasol.  Had issues with right knee pain and swelling in the past.  Used to play soccer and was physically active when she was younger.  Does not recall any specific knee injury.  Has had issues with dependent edema and has been treated with diuretics.  History of joint pain in her hands.  History of depression and insomnia.  This started around the time her father passed away several years ago.  She takes Effexor.  She takes Ambien every night to sleep as well as melatonin.  She had abdominoplasty in [place] surgery in 2017.  Tonsillectomy 2005.  Cholecystectomy 2003.  C-section 2003.  In vitro fertilization 2015.  Gastric sleeve surgery 2012.  She lost about 80 pounds with gastric sleeve surgery but gained some 50 pounds back around the time of her father's illness and death.  Social history: She is married.  Has a college education.  1 teenage daughter.  Husband works for a Data processing manager.  Family history: Father died at age 2 with complications of alpha-1 antitrypsin pulmonary disease and nonalcoholic hepatic cirrhosis.  Mother living in good health but recently had COVID-19.  1 brother who is an alpha-1 antitrypsin carrier.  Patient  has type 2 diabetes mellitus which she has been controlling with oral agents.  Reminded about annual diabetic eye exam.  Reminded about pneumococcal vaccines that have been recommended with diabetes.   Review of Systems denies chest pain shortness of breath constipation diarrhea nasal congestion sore throat     Objective:   Physical Exam  Vital signs reviewed.  Skin warm and dry.  Nodes none.  TMs are clear.  Neck is supple.  No thyromegaly.  Chest clear to auscultation.  Cardiac exam regular rate and rhythm normal S1 and S2.  Extremities without edema.  Abdomen obese soft nondistended without hepatosplenomegaly masses or tenderness.  Pelvic exam is deferred to GYN physician.  Diabetic foot exam is within normal limits.      Assessment & Plan:

## 2019-08-10 LAB — URINE CULTURE
MICRO NUMBER:: 10042479
Result:: NO GROWTH
SPECIMEN QUALITY:: ADEQUATE

## 2019-08-16 ENCOUNTER — Other Ambulatory Visit: Payer: Self-pay | Admitting: Internal Medicine

## 2019-08-16 ENCOUNTER — Other Ambulatory Visit: Payer: Self-pay

## 2019-08-16 MED ORDER — JANUMET 50-500 MG PO TABS: ORAL_TABLET | ORAL | 5 refills | Status: DC

## 2019-08-16 MED ORDER — VENLAFAXINE HCL ER 75 MG PO CP24: ORAL_CAPSULE | ORAL | 1 refills | Status: DC

## 2019-09-06 ENCOUNTER — Other Ambulatory Visit: Payer: Self-pay | Admitting: Internal Medicine

## 2019-09-16 NOTE — Progress Notes (Signed)
Subjective:    Patient ID: Rebecca Snyder, female    DOB: 23-Aug-1980, 39 y.o.   MRN: [id number]  HPI 39 year old Female in today health maintenance exam and evaluation of medical issues.  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  Her mother contracted COVID-19 and she moved to [place] to be with her mother for a number of weeks and mother recovered.  In December 2017 she had a lower extremity Doppler study of the right leg because of pain and edema for 4 weeks.  This was negative.  History of psoriasis affecting face ears and elbow and uses clobetasol.  Has had issues with right knee pain and swelling.  Used to play soccer and was physically active when she was younger.  Does not recall any specific knee injury.  Issues with dependent edema and has been tried on diuretics.  History of joint pain in her hands.  History of depression and insomnia which is longstanding.  Was started around the time her father passed away several years ago.  She was very close to him.  She takes Effexor.  She takes Ambien every night to sleep as well as melatonin.  She had abdominoplasty in [place] surgery in 2017.  Tonsillectomy 2005.  Cholecystectomy 2003 C-section 2003.  She had in vitro fertilization 2015.  She had gastric sleeve surgery 2012.  She lost about 80 pounds with gastric sleeve surgery but gained some 50 pounds back around the time of her father's illness and death.  She remains overweight.  Social history: She is married.  Has a college education.  1 teenage daughter.  Husband works for a Char[employer] fundraiser.  Family history: Father died at age 5 with complications of alpha-1 antitrypsin pulmonary disease and nonalcoholic hepatic cirrhosis.  Mother living in her early 59s.  1 brother age 3 who is an alpha-1 antitrypsin carrier.  Reminded about annual diabetic eye  exam.    Review of Systems  Respiratory: Negative.   Cardiovascular: Negative.   Gastrointestinal: Negative.   Genitourinary: Negative.   Neurological: Negative.   Psychiatric/Behavioral:       History of anxiety and insomnia       Objective:   Physical Exam Blood pressure 98/60 BMI 33.97 weight 230 pounds pulse 87 temperature 98 degrees orally.  Skin warm and dry.  Nodes none.  Neck is supple.  Chest clear to auscultation.  Cardiac exam regular rate and rhythm.  Breast without masses.  Abdomen obese soft nondistended without hepatosplenomegaly masses or tenderness.  GYN exam deferred to GYN.  No pitting edema of the lower extremities.  Neuro intact without focal deficits.  Affect thought and judgment appear to be normal.         Assessment & Plan:  Type 2 diabetes mellitus-non-insulin-dependent stable at hemoglobin A1c of 6.5% She is on Janumet  Longstanding insomnia-takes Ambien nightly  History of anxiety and depression treated with SSRI  Essential hypertension treated with Coreg  History of psoriasis treated with [place] [facility]  History of vitamin D deficiency with level being 29 2 years ago and 51-year ago.  Was not checked with this visit  BMI 33.97-continue with diet and exercise efforts-in 2019 BMI was 37.46.  History of abdominoplasty and back lift surgery 2017  History of gastric sleeve surgery  Family history of alpha-1 antitrypsin deficiency pulmonary disease  Plan: Likely would benefit  from [facility].  Continue current medications and follow-up in 6 months.

## 2019-09-18 NOTE — Patient Instructions (Addendum)
It was a pleasure to see you today.  Continue current medications and follow-up in 6 months.  Congratulations on weight loss efforts.

## 2020-02-05 ENCOUNTER — Other Ambulatory Visit: Payer: 59 | Admitting: Internal Medicine

## 2020-02-05 ENCOUNTER — Other Ambulatory Visit: Payer: Self-pay

## 2020-02-05 DIAGNOSIS — E785 Hyperlipidemia, unspecified: Secondary | ICD-10-CM

## 2020-02-05 DIAGNOSIS — E1121 Type 2 diabetes mellitus with diabetic nephropathy: Secondary | ICD-10-CM

## 2020-02-05 DIAGNOSIS — E7800 Pure hypercholesterolemia, unspecified: Secondary | ICD-10-CM

## 2020-02-06 LAB — HEPATIC FUNCTION PANEL
AG Ratio: 1.1 (calc) (ref 1.0–2.5)
ALT: 32 U/L — ABNORMAL HIGH (ref 6–29)
AST: 35 U/L — ABNORMAL HIGH (ref 10–30)
Albumin: 4.1 g/dL (ref 3.6–5.1)
Alkaline phosphatase (APISO): 90 U/L (ref 31–125)
Bilirubin, Direct: 0.1 mg/dL (ref 0.0–0.2)
Globulin: 3.6 g/dL (calc) (ref 1.9–3.7)
Indirect Bilirubin: 0.5 mg/dL (calc) (ref 0.2–1.2)
Total Bilirubin: 0.6 mg/dL (ref 0.2–1.2)
Total Protein: 7.7 g/dL (ref 6.1–8.1)

## 2020-02-06 LAB — LIPID PANEL
Cholesterol: 225 mg/dL — ABNORMAL HIGH (ref <200)
HDL: 56 mg/dL (ref >=50)
LDL Cholesterol (Calc): 136 mg/dL (calc) — ABNORMAL HIGH
Non-HDL Cholesterol (Calc): 169 mg/dL (calc) — ABNORMAL HIGH (ref <130)
Total CHOL/HDL Ratio: 4 (calc) (ref <5.0)
Triglycerides: 190 mg/dL — ABNORMAL HIGH (ref <150)

## 2020-02-06 LAB — HEMOGLOBIN A1C
Hgb A1c MFr Bld: 8.5 % of total Hgb — ABNORMAL HIGH (ref <5.7)
Mean Plasma Glucose: 197 (calc)
eAG (mmol/L): 10.9 (calc)

## 2020-02-07 ENCOUNTER — Encounter: Payer: Self-pay | Admitting: Internal Medicine

## 2020-02-07 ENCOUNTER — Other Ambulatory Visit: Payer: Self-pay

## 2020-02-07 ENCOUNTER — Ambulatory Visit (INDEPENDENT_AMBULATORY_CARE_PROVIDER_SITE_OTHER): Payer: 59 | Admitting: Internal Medicine

## 2020-02-07 VITALS — BP 120/80 | HR 86 | Ht 69.0 in | Wt 244.0 lb

## 2020-02-07 DIAGNOSIS — Z8659 Personal history of other mental and behavioral disorders: Secondary | ICD-10-CM

## 2020-02-07 DIAGNOSIS — J22 Unspecified acute lower respiratory infection: Secondary | ICD-10-CM

## 2020-02-07 DIAGNOSIS — L309 Dermatitis, unspecified: Secondary | ICD-10-CM

## 2020-02-07 DIAGNOSIS — R609 Edema, unspecified: Secondary | ICD-10-CM

## 2020-02-07 DIAGNOSIS — I10 Essential (primary) hypertension: Secondary | ICD-10-CM

## 2020-02-07 DIAGNOSIS — G4709 Other insomnia: Secondary | ICD-10-CM | POA: Diagnosis not present

## 2020-02-07 DIAGNOSIS — E1165 Type 2 diabetes mellitus with hyperglycemia: Secondary | ICD-10-CM

## 2020-02-07 DIAGNOSIS — Z872 Personal history of diseases of the skin and subcutaneous tissue: Secondary | ICD-10-CM

## 2020-02-07 DIAGNOSIS — Z6836 Body mass index (BMI) 36.0-36.9, adult: Secondary | ICD-10-CM

## 2020-02-07 MED ORDER — FLUCONAZOLE 150 MG PO TABS: ORAL_TABLET | ORAL | 1 refills | Status: DC

## 2020-02-07 MED ORDER — AMOXICILLIN-POT CLAVULANATE 875-125 MG PO TABS: 1.0000 | ORAL_TABLET | Freq: Two times a day (BID) | ORAL | 0 refills | Status: DC

## 2020-02-07 NOTE — Progress Notes (Signed)
   Subjective:    Patient ID: Rebecca Snyder, female    DOB: 1980-10-20, 39 y.o.   MRN: [id number]  HPI 39 year old Female seen for follow up on 35-month follow-up on dependent edema, history of diabetes mellitus, history of depression, insomnia.  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  She has gotten a promotion and is excited about that.  Congratulated her on this promotion.  She is also finished a Masters degree.  There was stress trying to do this and work full-time.  Was not always eating well.  Remains on Effexor XR 75 mg daily for depression.  Takes Ambien 10 mg at bedtime.  Is on Janumet 50/500 twice daily for diabetes mellitus.  Is on carvedilol 12.5 mg daily for mild hypertension.  In January her liver functions were normal.  She now has SGOT of 35 and SGPT of 32.  Lipid panel shows total cholesterol of 225, HDL of 56, triglycerides of 190 and LDL cholesterol of 136.  In January her lipids were normal with the exception of a mildly elevated LDL of 109.  Admits to not following a low-fat diet.  Hemoglobin A1c 8.5%.  Elevation of liver functions is likely due to fatty liver. She does not want to be on statin medication. We discussed various options including adding more glucose lowering medication but she prefers to work on diet and exercise at this point in time since she is finished her Masters degree and will be taking a new job with hopefully less stress.  Her weight in January was 230 pounds and is now 244 pounds.  She has had 2 COVID-19 vaccines.  Review of Systems see above     Objective:   Physical Exam Neck is supple without JVD thyromegaly or carotid bruits.  Chest clear to auscultation.  Cardiac exam regular rate and rhythm. Blood pressure 120/80 pulse 86 weight 244 pounds pulse oximetry 96% BMI 36.03.  See diabetic foot exam and depression screening    Assessment & Plan:  Insomnia-okay to refill Ambien  History of  depression treated with Effexor and doing well  Situational stress with studying for masters degree while working full-time.  Daughter is also had some issues.  Hyperlipidemia-does not want to be on statin therapy  Elevated liver functions-likely due to fatty liver  History of dependent edema  Blood pressure stable with carvedilol  History of psoriasis treated with Temovate  Respiratory congestion-treated with Augmentin 875 mg twice daily for 10 days.  Given Diflucan in case patient develops yeast infection while on antibiotic therapy.  Return in 6 months.  Work on diet exercise and weight loss.  Take Augmentin as directed for 10 days and take Diflucan if needed for Candida vaginitis.  Continue with current medications.

## 2020-02-10 ENCOUNTER — Other Ambulatory Visit: Payer: Self-pay | Admitting: Internal Medicine

## 2020-02-17 ENCOUNTER — Encounter: Payer: Self-pay | Admitting: Internal Medicine

## 2020-02-17 NOTE — Patient Instructions (Signed)
Take Augmentin as directed for respiratory symptoms.  May need Diflucan for Candida vaginitis.  Continue current medications.  Work on diet exercise and weight loss.  Return in 6 months.

## 2020-03-20 ENCOUNTER — Other Ambulatory Visit: Payer: Self-pay | Admitting: Internal Medicine

## 2020-03-20 NOTE — Telephone Encounter (Signed)
Okay to refill has CPE scheduled in January.

## 2020-05-20 LAB — HM DIABETES EYE EXAM

## 2020-06-01 ENCOUNTER — Other Ambulatory Visit: Payer: Self-pay | Admitting: Internal Medicine

## 2020-08-06 NOTE — Progress Notes (Signed)
40 y.o. G52P1011 Married Caucasian female here for annual exam.    Menses can be heavy.  Does not want to treat.   Working with dialysis patients.   Received her Covid vaccine booster and flu vaccine.   May move to [place].  PCP:   Luanna Cole.Baxley, MD  Patient's last menstrual period was 08/02/2020 (exact date).     Period Cycle (Days): 30 Period Duration (Days): 5-6 days Period Pattern: Regular Menstrual Flow: Heavy Menstrual Control: Maxi pad,Tampon Menstrual Control Change Freq (Hours): changes tampon every 2-3 hours on heaviest day Dysmenorrhea: (!) Mild Dysmenorrhea Symptoms: Cramping     Sexually active: Yes.    The current method of family planning is none.    Exercising: No.  The patient does not participate in regular exercise at present. Smoker:  no  Health Maintenance: Pap:  03-18-17 Neg:Neg HR HPV.  States her prior pap in 2019. History of abnormal Pap:  Yes, Hx of colposcopy years ago but no treatment. MMG: n/a Colonoscopy: n/a BMD:   n/a  Result  n/a TDaP:  With PCP  Gardasil:   no HIV: unsure but Neg in past Hep C: unsure but Neg in past Screening Labs:  PCP today.    reports that she has quit smoking. She has never used smokeless tobacco. She reports that she does not drink alcohol and does not use drugs.  Past Medical History:  Diagnosis Date   Amenorrhea    Depression    Elevated liver enzymes    Hormone disorder    Infertility, female    IVF   PCOS (polycystic ovarian syndrome) 1996   Psoriasis     Past Surgical History:  Procedure Laterality Date   BACK SURGERY  2013   laminectomy/discectomy   CESAREAN SECTION     CHOLECYSTECTOMY     COSMETIC SURGERY     tummy tuck and back lift   LAPAROSCOPIC GASTRIC SLEEVE RESECTION     TONSILLECTOMY      Current Outpatient Medications  Medication Sig Dispense Refill   carvedilol (COREG) 25 MG tablet Take 25 mg by mouth 2 (two) times daily.     cholecalciferol (VITAMIN D) 1000 units  tablet Take 4,000 Units by mouth daily.     clobetasol (TEMOVATE) 0.05 % external solution Apply 1 application topically 2 (two) times daily. 50 mL 1   clobetasol (TEMOVATE) 0.05 % external solution APPLY 1 APPLICATION TOPICALLY AS NEEDED. 50 mL 1   clobetasol ointment (TEMOVATE) 0.05 % Apply topically 2 (two) times daily. 30 g 5   JANUMET 50-500 MG tablet TAKE 1 TABLET BY MOUTH 2 (TWO) TIMES DAILY WITH A MEAL. 60 tablet 2   Melatonin 10 MG CAPS Take by mouth.     Multiple Vitamins-Minerals (MULTIVITAMIN PO) Take by mouth daily.     ONE TOUCH ULTRA TEST test strip USE AS DIRECTED 4 TIMES DAILY 100 each prn   ONETOUCH DELICA LANCETS 33G MISC USE AS DIRECTED 4 TIMES DAILY 100 each 0   SKYRIZI, 150 MG DOSE, 75 MG/0.83ML PSKT Inject into the skin.     venlafaxine XR (EFFEXOR-XR) 75 MG 24 hr capsule TAKE 1 CAPSULE BY MOUTH EVERY DAY 90 capsule 1   zolpidem (AMBIEN) 10 MG tablet TAKE 1 TABLET BY MOUTH EVERYDAY AT BEDTIME 90 tablet 0   No current facility-administered medications for this visit.    Family History  Problem Relation Age of Onset   Hypertension Mother        improved after weight  loss   Diabetes Mother        better after weight loss   Liver disease Father        non-alcoholic, caused by alpha-1 antitrypsin deficiency   Alpha-1 antitrypsin deficiency Father    Lung cancer Maternal Grandmother    Heart attack Maternal Grandfather     Review of Systems  All other systems reviewed and are negative.   Exam:   BP 126/74 (Cuff Size: Large)    Pulse 87    Ht 5\' 9"  (1.753 m)    Wt 239 lb (108.4 kg)    LMP 08/02/2020 (Exact Date)    SpO2 97%    BMI 35.29 kg/m     General appearance: alert, cooperative and appears stated age Head: normocephalic, without obvious abnormality, atraumatic Neck: no adenopathy, supple, symmetrical, trachea midline and thyroid normal to inspection and palpation Lungs: clear to auscultation bilaterally Breasts: normal appearance, no  masses or tenderness, No nipple retraction or dimpling, No nipple discharge or bleeding, No axillary adenopathy Heart: regular rate and rhythm Abdomen: soft, non-tender; no masses, no organomegaly Extremities: extremities normal, atraumatic, no cyanosis or edema Skin: skin color, texture, turgor normal. No rashes or lesions Lymph nodes: cervical, supraclavicular, and axillary nodes normal. Neurologic: grossly normal  Pelvic: External genitalia:  no lesions              No abnormal inguinal nodes palpated.              Urethra:  normal appearing urethra with no masses, tenderness or lesions              Bartholins and Skenes: normal                 Vagina: normal appearing vagina with normal color and discharge, no lesions              Cervix: no lesions.  Menstrual flow noted.               Pap taken: No. Bimanual Exam:  Uterus:  normal size, contour, position, consistency, mobility, non-tender              Adnexa: no mass, fullness, tenderness        Chaperone was present for exam.  Assessment:   Well woman visit with normal exam. Hx PCOS.  Remote hx abnormal pap. Hx infertility. Would welcome a pregnancy.  Hx weight loss surgery. Hx abdominoplasty. Hx recurrent BV. DM.  Psoriasis.  Plan: Mammogram screening discussed starting age 63 yo.  Self breast awareness reviewed. Pap and HR HPV as above. Guidelines for Calcium, Vitamin D, regular exercise program including cardiovascular and weight bearing exercise. PNV. Follow up annually and prn.

## 2020-08-07 ENCOUNTER — Encounter: Payer: Self-pay | Admitting: Obstetrics and Gynecology

## 2020-08-07 ENCOUNTER — Other Ambulatory Visit: Payer: Self-pay

## 2020-08-07 ENCOUNTER — Other Ambulatory Visit: Payer: Self-pay | Admitting: Internal Medicine

## 2020-08-07 ENCOUNTER — Other Ambulatory Visit: Payer: 59 | Admitting: Internal Medicine

## 2020-08-07 ENCOUNTER — Ambulatory Visit (INDEPENDENT_AMBULATORY_CARE_PROVIDER_SITE_OTHER): Payer: 59 | Admitting: Obstetrics and Gynecology

## 2020-08-07 VITALS — BP 126/74 | HR 87 | Ht 69.0 in | Wt 239.0 lb

## 2020-08-07 DIAGNOSIS — Z01419 Encounter for gynecological examination (general) (routine) without abnormal findings: Secondary | ICD-10-CM

## 2020-08-07 DIAGNOSIS — E1165 Type 2 diabetes mellitus with hyperglycemia: Secondary | ICD-10-CM

## 2020-08-07 DIAGNOSIS — Z0000 Encounter for general adult medical examination without abnormal findings: Secondary | ICD-10-CM

## 2020-08-07 DIAGNOSIS — Z6836 Body mass index (BMI) 36.0-36.9, adult: Secondary | ICD-10-CM

## 2020-08-07 DIAGNOSIS — L309 Dermatitis, unspecified: Secondary | ICD-10-CM

## 2020-08-07 DIAGNOSIS — I10 Essential (primary) hypertension: Secondary | ICD-10-CM

## 2020-08-07 DIAGNOSIS — E1121 Type 2 diabetes mellitus with diabetic nephropathy: Secondary | ICD-10-CM

## 2020-08-07 DIAGNOSIS — Z9884 Bariatric surgery status: Secondary | ICD-10-CM

## 2020-08-07 NOTE — Patient Instructions (Signed)

## 2020-08-08 LAB — CBC WITH DIFFERENTIAL/PLATELET
Absolute Monocytes: 530 cells/uL (ref 200–950)
Basophils Absolute: 42 cells/uL (ref 0–200)
Basophils Relative: 0.4 %
Eosinophils Absolute: 64 cells/uL (ref 15–500)
Eosinophils Relative: 0.6 %
HCT: 47 % — ABNORMAL HIGH (ref 35.0–45.0)
Hemoglobin: 15.3 g/dL (ref 11.7–15.5)
Lymphs Abs: 2692 cells/uL (ref 850–3900)
MCH: 31.2 pg (ref 27.0–33.0)
MCHC: 32.6 g/dL (ref 32.0–36.0)
MCV: 95.7 fL (ref 80.0–100.0)
MPV: 9.7 fL (ref 7.5–12.5)
Monocytes Relative: 5 %
Neutro Abs: 7272 cells/uL (ref 1500–7800)
Neutrophils Relative %: 68.6 %
Platelets: 306 10*3/uL (ref 140–400)
RBC: 4.91 10*6/uL (ref 3.80–5.10)
RDW: 12.5 % (ref 11.0–15.0)
Total Lymphocyte: 25.4 %
WBC: 10.6 10*3/uL (ref 3.8–10.8)

## 2020-08-08 LAB — COMPLETE METABOLIC PANEL WITH GFR
AG Ratio: 1.1 (calc) (ref 1.0–2.5)
ALT: 30 U/L — ABNORMAL HIGH (ref 6–29)
AST: 23 U/L (ref 10–30)
Albumin: 4.3 g/dL (ref 3.6–5.1)
Alkaline phosphatase (APISO): 87 U/L (ref 31–125)
BUN: 21 mg/dL (ref 7–25)
CO2: 24 mmol/L (ref 20–32)
Calcium: 9.8 mg/dL (ref 8.6–10.2)
Chloride: 99 mmol/L (ref 98–110)
Creat: 0.59 mg/dL (ref 0.50–1.10)
GFR, Est African American: 134 mL/min/{1.73_m2} (ref >=60)
GFR, Est Non African American: 115 mL/min/{1.73_m2} (ref >=60)
Globulin: 3.8 g/dL (calc) — ABNORMAL HIGH (ref 1.9–3.7)
Glucose, Bld: 262 mg/dL — ABNORMAL HIGH (ref 65–99)
Potassium: 4.1 mmol/L (ref 3.5–5.3)
Sodium: 135 mmol/L (ref 135–146)
Total Bilirubin: 0.5 mg/dL (ref 0.2–1.2)
Total Protein: 8.1 g/dL (ref 6.1–8.1)

## 2020-08-08 LAB — MICROALBUMIN / CREATININE URINE RATIO
Creatinine, Urine: 178 mg/dL (ref 20–275)
Microalb Creat Ratio: 60 mcg/mg creat — ABNORMAL HIGH (ref <30)
Microalb, Ur: 10.6 mg/dL

## 2020-08-08 LAB — TSH: TSH: 2.32 mIU/L

## 2020-08-08 LAB — LIPID PANEL
Cholesterol: 231 mg/dL — ABNORMAL HIGH (ref <200)
HDL: 61 mg/dL (ref >=50)
LDL Cholesterol (Calc): 145 mg/dL (calc) — ABNORMAL HIGH
Non-HDL Cholesterol (Calc): 170 mg/dL (calc) — ABNORMAL HIGH (ref <130)
Total CHOL/HDL Ratio: 3.8 (calc) (ref <5.0)
Triglycerides: 124 mg/dL (ref <150)

## 2020-08-08 LAB — HEMOGLOBIN A1C
Hgb A1c MFr Bld: 9.3 % of total Hgb — ABNORMAL HIGH (ref <5.7)
Mean Plasma Glucose: 220 mg/dL
eAG (mmol/L): 12.2 mmol/L

## 2020-08-11 ENCOUNTER — Encounter: Payer: 59 | Admitting: Internal Medicine

## 2020-08-25 ENCOUNTER — Encounter: Payer: Self-pay | Admitting: Internal Medicine

## 2020-08-25 ENCOUNTER — Ambulatory Visit (INDEPENDENT_AMBULATORY_CARE_PROVIDER_SITE_OTHER): Payer: 59 | Admitting: Internal Medicine

## 2020-08-25 ENCOUNTER — Other Ambulatory Visit: Payer: Self-pay

## 2020-08-25 VITALS — BP 100/80 | HR 87 | Ht 69.0 in | Wt 235.0 lb

## 2020-08-25 DIAGNOSIS — Z0000 Encounter for general adult medical examination without abnormal findings: Secondary | ICD-10-CM | POA: Diagnosis not present

## 2020-08-25 DIAGNOSIS — I10 Essential (primary) hypertension: Secondary | ICD-10-CM | POA: Diagnosis not present

## 2020-08-25 DIAGNOSIS — Z8659 Personal history of other mental and behavioral disorders: Secondary | ICD-10-CM | POA: Diagnosis not present

## 2020-08-25 DIAGNOSIS — G4709 Other insomnia: Secondary | ICD-10-CM | POA: Diagnosis not present

## 2020-08-25 DIAGNOSIS — E1165 Type 2 diabetes mellitus with hyperglycemia: Secondary | ICD-10-CM

## 2020-08-25 DIAGNOSIS — R609 Edema, unspecified: Secondary | ICD-10-CM | POA: Diagnosis not present

## 2020-08-25 DIAGNOSIS — Z872 Personal history of diseases of the skin and subcutaneous tissue: Secondary | ICD-10-CM

## 2020-08-25 LAB — POCT URINALYSIS DIPSTICK
Appearance: NEGATIVE
Bilirubin, UA: NEGATIVE
Glucose, UA: POSITIVE — AB
Ketones, UA: NEGATIVE
Leukocytes, UA: NEGATIVE
Nitrite, UA: NEGATIVE
Odor: NEGATIVE
Protein, UA: POSITIVE — AB
Spec Grav, UA: >=1.03 — AB (ref 1.010–1.025)
Urobilinogen, UA: 0.2 E.U./dL
pH, UA: 5 (ref 5.0–8.0)

## 2020-08-25 NOTE — Patient Instructions (Addendum)
Try  Janumet 100/1000 daily.  Sample provided.  RTC in 8 weeks.  Continue Coreg for hypertension.  Monitor Accu-Cheks carefully.  Continue with Effexor and Ambien.

## 2020-08-25 NOTE — Progress Notes (Signed)
Subjective:    Patient ID: Rebecca Snyder, female    DOB: November 20, 1980, 40 y.o.   MRN: [id number]  HPI 40 year old Female for health maintenance exam and evaluation of medical issues.  Work has been stressful.  Has not been able to diet and exercise as much as she would like.  She has history of diabetes mellitus treated with Janumet 5--500 daily.  She has a history of hypertension, insomnia and depression.  Saw Dr. Edward Jolly for GYN exam August 07, 2020.  She and her family moved here in the Fall 2017 from [place].  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  She has a history of psoriasis affecting her face, ears, elbow and uses clobetasol.  Is also on Skyrizi.  History of dependent edema treated with diuretics.  Depression and insomnia is longstanding and started around the time her father passed away several years ago.  She had abdominoplasty and back lift surgery in 2017.  Tonsillectomy 2005.  Cholecystectomy 2003.  C-section 2003.  In vitro fertilization 2015.  Gastric sleeve surgery 2012.  She lost about 80 pounds with gastric sleeve surgery but gained weight back around the time of her father's illness and death.  Has had issues with right knee pain and swelling.  Used to play soccer and was physically active when she was younger.  Does not recall any specific knee injury.  Has joint pain in her hands.  She has a history of hypertension treated with Coreg 25 mg every morning.    Social history: She is married.  Has a college education and recently got a masters degree.  1 teenage daughter who will graduate from high school.  Husband works for a [employer].  They are anticipating moving back to [place] in the next few months.  Family history: Father died at age 16 with complications of alpha-1 antitrypsin pulmonary disease and nonalcoholic hepatic cirrhosis.  Mother living in good health in her 91s.  1 brother in his 13s who is an alpha 1  antitrypsin carrier.  Regarding fasting labs total cholesterol was 231 and LDL cholesterol 145.  Triglycerides are normal at 124 and HDL cholesterol is 61.  Has not wanted to be on statin medication but  should be with history of diabetes mellitus.  Had diabetic eye exam- about 3-5 months ago at [facility].  We do not have a copy of this report. Review of Systems Situational stress with work.  Daughter seems to be doing fairly well.  Looking to move to [facility].    Objective:   Physical Exam Blood pressure 100/80 pulse 87 pulse oximetry 97% weight 235 pounds height 5 feet 9 inches BMI 34.70 skin is warm and dry.  Nodes none.  TMs are clear but left TM looks a bit thickened with splayed light reflex.  It is not red.  Patient says that ENT told her it looked different likely due to some TMJ issue.  Neck is supple.  No carotid bruits or thyromegaly.  Chest clear to auscultation.  Breast and pelvic exams deferred to GYN physician abdomen obese soft nondistended without hepatosplenomegaly masses or tenderness.  No lower extremity pitting edema.  Affect thought and judgment are normal.       Assessment & Plan:  BMI 34.70-history of gastric sleeve surgery.  Continue to work on diet exercise and weight loss  Type 2 diabetes mellitus-control could be better.  Hemoglobin A1c 9.3% and was  8.5% July 2021.  Last hemoglobin A1c over the past few years was 6.5% in January 2021.  Increase Janumet to [phone].  Follow-up in early April with hemoglobin A1c.  History of psoriasis treated with Skyrizi  History of insomnia longstanding treated with Ambien  Depression treated with Effexor  Hypertension treated with Coreg 25 mg daily-excellent blood pressure reading today.  Plan: Increase Janumet to [phone] daily and follow-up in early April.  May need to add additional medication if not improving.  No change in other medications.

## 2020-08-30 ENCOUNTER — Other Ambulatory Visit: Payer: Self-pay | Admitting: Internal Medicine

## 2020-09-04 ENCOUNTER — Other Ambulatory Visit: Payer: Self-pay | Admitting: Internal Medicine

## 2020-09-04 ENCOUNTER — Encounter: Payer: Self-pay | Admitting: Internal Medicine

## 2020-09-04 ENCOUNTER — Other Ambulatory Visit: Payer: Self-pay

## 2020-09-04 ENCOUNTER — Telehealth: Payer: Self-pay | Admitting: Internal Medicine

## 2020-09-04 MED ORDER — JANUMET XR 100-1000 MG PO TB24: 1.0000 | ORAL_TABLET | Freq: Every day | ORAL | 1 refills | Status: DC

## 2020-09-04 NOTE — Telephone Encounter (Signed)
Genever is going to send requirements for Travel Nurse letter thru Mychart. Then I will write letter.

## 2020-09-08 ENCOUNTER — Encounter: Payer: Self-pay | Admitting: Internal Medicine

## 2020-09-08 NOTE — Telephone Encounter (Signed)
Received requirements thru My chart, of what patient needed in letter.  Completed letter, it is in [place] and has also been mailed.

## 2020-09-18 ENCOUNTER — Other Ambulatory Visit: Payer: Self-pay | Admitting: Internal Medicine

## 2020-10-08 ENCOUNTER — Other Ambulatory Visit: Payer: Self-pay | Admitting: Internal Medicine

## 2020-10-30 ENCOUNTER — Ambulatory Visit: Payer: 59 | Admitting: Internal Medicine

## 2020-11-03 ENCOUNTER — Other Ambulatory Visit: Payer: Self-pay | Admitting: Internal Medicine

## 2021-01-09 ENCOUNTER — Other Ambulatory Visit: Payer: Self-pay | Admitting: Internal Medicine

## 2021-01-09 ENCOUNTER — Telehealth: Payer: Self-pay | Admitting: Internal Medicine

## 2021-01-09 ENCOUNTER — Other Ambulatory Visit: Payer: Self-pay

## 2021-01-09 MED ORDER — JANUMET XR 100-1000 MG PO TB24: 1.0000 | ORAL_TABLET | Freq: Every day | ORAL | 2 refills | Status: AC

## 2021-01-09 NOTE — Telephone Encounter (Signed)
Left message to call me back, need to know when she is moving.

## 2021-01-09 NOTE — Telephone Encounter (Signed)
She said she has moved and she needs janumet not carvedilol. She said she is waiting for a call back from her new PCP to see if they are going to take her as a patient.

## 2021-01-09 NOTE — Telephone Encounter (Addendum)
Received Fax RX request from  Pharmacy - [facility] [place], [place] [phone] - Phone [phone] - fax   Medication - JANUMET XR [phone] MG TB24 Wants 90 day supply  Last Refill -   Last OV - 08/25/2020  Last CPE - 08/25/2020  Next Appointment -

## 2024-03-22 ENCOUNTER — Other Ambulatory Visit: Payer: Self-pay | Admitting: Medical Genetics

## 2024-05-07 ENCOUNTER — Other Ambulatory Visit: Payer: Self-pay | Admitting: *Deleted

## 2024-05-07 DIAGNOSIS — Z006 Encounter for examination for normal comparison and control in clinical research program: Secondary | ICD-10-CM

## 2024-05-11 ENCOUNTER — Other Ambulatory Visit: Payer: Self-pay | Admitting: Medical Genetics

## 2024-05-11 DIAGNOSIS — Z006 Encounter for examination for normal comparison and control in clinical research program: Secondary | ICD-10-CM

## 2024-05-11 NOTE — Addendum Note (Signed)
 Addended by: REMUS NOEMI PARAS on: 05/11/2024 12:22 PM   Modules accepted: Orders

## 2024-06-15 LAB — GENECONNECT MOLECULAR SCREEN: Genetic Analysis Overall Interpretation: NEGATIVE
